# Patient Record
Sex: Female | Born: 1980
Health system: Southern US, Community
[De-identification: ages and names within clinical notes are randomized; demographics above are authoritative.]

## PROBLEM LIST (undated history)

## (undated) DIAGNOSIS — M549 Dorsalgia, unspecified: Secondary | ICD-10-CM

## (undated) DIAGNOSIS — R002 Palpitations: Secondary | ICD-10-CM

## (undated) DIAGNOSIS — I493 Ventricular premature depolarization: Secondary | ICD-10-CM

## (undated) HISTORY — DX: Dorsalgia, unspecified: M54.9

## (undated) HISTORY — PX: NO PAST SURGERIES: SHX2092

## (undated) HISTORY — DX: Palpitations: R00.2

## (undated) HISTORY — DX: Ventricular premature depolarization: I49.3

---

## 2018-08-31 ENCOUNTER — Other Ambulatory Visit: Payer: Self-pay

## 2018-08-31 ENCOUNTER — Encounter (HOSPITAL_COMMUNITY): Payer: Self-pay | Admitting: Family Medicine

## 2018-08-31 ENCOUNTER — Ambulatory Visit (HOSPITAL_COMMUNITY)
Admission: EM | Admit: 2018-08-31 | Discharge: 2018-08-31 | Disposition: A | Discharge status: Home / Self Care | Payer: 59 | Attending: Family Medicine | Admitting: Family Medicine

## 2018-08-31 DIAGNOSIS — J029 Acute pharyngitis, unspecified: Secondary | ICD-10-CM | POA: Insufficient documentation

## 2018-08-31 LAB — POCT RAPID STREP A: Streptococcus, Group A Screen (Direct): NEGATIVE

## 2018-08-31 NOTE — Discharge Instructions (Addendum)
Your rapid strep test was negative We will send for culture This is most likely a viral illness.  You can keep taking the over-the-counter medication as needed for symptoms Follow up as needed for continued or worsening symptoms

## 2018-08-31 NOTE — ED Provider Notes (Signed)
[facility]    CSN: [id number] Arrival date & time: 08/31/18  2951     History   Chief Complaint Chief Complaint  Patient presents with  . Sore Throat    HPI Monica Washington is a 38 y.o. female.   Pt is a 38 year old female that presents with 3 to 4 days of sore throat, fever. Reported fever 100.1 at home. She works at [employer] and was recently [place] tested  On Monday which was negative. Mild cough without SOB. Change in taste and smell. Symptoms have been constant. Taking Theraflu and dayquil for symptoms. Denies hx of allergies. No recent sick contacts or recent travels.   ROS per HPI      History reviewed. No pertinent past medical history.  There are no active problems to display for this patient.   History reviewed. No pertinent surgical history.  OB History   No obstetric history on file.      Home Medications    Prior to Admission medications   Not on File    Family History Family History  Problem Relation Age of Onset  . Healthy Mother   . Diabetes Father     Social History Social History   Tobacco Use  . Smoking status: Current Some Day Smoker  Substance Use Topics  . Alcohol use: Never    Frequency: Never  . Drug use: Never     Allergies   Patient has no known allergies.   Review of Systems Review of Systems   Physical Exam Triage Vital Signs ED Triage Vitals [08/31/18 0844]  Enc Vitals Group     BP      Pulse      Resp      Temp      Temp src      SpO2      Weight      Height      Head Circumference      Peak Flow      Pain Score 7     Pain Loc      Pain Edu?      Excl. in [place]?    No data found.  Updated Vital Signs BP (!) 95/59 (BP Location: Left Arm) Comment (BP Location): large cuff  Pulse 96   Temp 98.3 F (36.8 C) (Oral)   Resp 16   LMP 08/14/2018   SpO2 98%   Visual Acuity Right Eye Distance:   Left Eye Distance:   Bilateral Distance:    Right Eye Near:   Left Eye Near:     Bilateral Near:     Physical Exam Vitals signs and nursing note reviewed.  Constitutional:      General: She is not in acute distress.    Appearance: She is not ill-appearing, toxic-appearing or diaphoretic.  HENT:     Head: Normocephalic and atraumatic.     Right Ear: A middle ear effusion is present.     Left Ear: Tympanic membrane and ear canal normal.     Mouth/Throat:     Pharynx: Uvula midline. Posterior oropharyngeal erythema present.     Tonsils: 1+ on the right. 1+ on the left.  Neck:     Musculoskeletal: Normal range of motion.  Cardiovascular:     Rate and Rhythm: Normal rate and regular rhythm.     Heart sounds: Normal heart sounds.  Pulmonary:     Effort: Pulmonary effort is normal.  Lymphadenopathy:     Cervical: No  cervical adenopathy.  Skin:    General: Skin is warm and dry.     Findings: No rash.  Neurological:     Mental Status: She is alert.  Psychiatric:        Mood and Affect: Mood normal.      UC Treatments / Results  Labs (all labs ordered are listed, but only abnormal results are displayed) Labs Reviewed  CULTURE, GROUP A STREP [facility])  POCT RAPID STREP A    EKG   Radiology No results found.  Procedures Procedures (including critical care time)  Medications Ordered in UC Medications - No data to display  Initial Impression / Assessment and Plan / UC Course  I have reviewed the triage vital signs and the nursing notes.  Pertinent labs & imaging results that were available during my care of the patient were reviewed by me and considered in my medical decision making (see chart for details).     Rapid strep test negative.  Sending for culture. Most likely diagnosis is viral pharyngitis. Recommended continued over-the-counter meds as needed for treatment. Follow up as needed for continued or worsening symptoms  Final Clinical Impressions(s) / UC Diagnoses   Final diagnoses:  Viral pharyngitis     Discharge Instructions      Your rapid strep test was negative We will send for culture This is most likely a viral illness.  You can keep taking the over-the-counter medication as needed for symptoms Follow up as needed for continued or worsening symptoms     ED Prescriptions    None     Controlled Substance Prescriptions [place] Controlled Substance Registry consulted? Not Applicable   Orvan July, NP 08/31/18 [phone]

## 2018-08-31 NOTE — ED Triage Notes (Signed)
Sore throat and fever started Sunday (100.1)  Patient works at [employer] and employer sent her to their employer dictated site for covid testing-reported as negative Minimal cough No sob Slight taste and smell changes

## 2018-09-02 LAB — CULTURE, GROUP A STREP (THRC)

## 2018-10-11 ENCOUNTER — Other Ambulatory Visit: Payer: Self-pay | Admitting: Student

## 2018-10-11 DIAGNOSIS — Z1231 Encounter for screening mammogram for malignant neoplasm of breast: Secondary | ICD-10-CM

## 2018-11-25 ENCOUNTER — Ambulatory Visit
Admission: RE | Admit: 2018-11-25 | Discharge: 2018-11-25 | Disposition: A | Discharge status: Home / Self Care | Payer: 59 | Source: Ambulatory Visit | Attending: Student | Admitting: Student

## 2018-11-25 ENCOUNTER — Other Ambulatory Visit: Payer: Self-pay

## 2018-11-25 DIAGNOSIS — Z1231 Encounter for screening mammogram for malignant neoplasm of breast: Secondary | ICD-10-CM

## 2019-11-15 ENCOUNTER — Other Ambulatory Visit: Payer: Self-pay | Admitting: Student

## 2019-11-15 DIAGNOSIS — Z1231 Encounter for screening mammogram for malignant neoplasm of breast: Secondary | ICD-10-CM

## 2019-11-29 ENCOUNTER — Other Ambulatory Visit: Payer: Self-pay

## 2019-11-29 ENCOUNTER — Ambulatory Visit
Admission: RE | Admit: 2019-11-29 | Discharge: 2019-11-29 | Disposition: A | Discharge status: Home / Self Care | Payer: 59 | Source: Ambulatory Visit | Attending: Student | Admitting: Student

## 2019-11-29 DIAGNOSIS — Z1231 Encounter for screening mammogram for malignant neoplasm of breast: Secondary | ICD-10-CM

## 2020-01-16 ENCOUNTER — Ambulatory Visit (INDEPENDENT_AMBULATORY_CARE_PROVIDER_SITE_OTHER): Payer: 59 | Admitting: Physician Assistant

## 2020-01-16 ENCOUNTER — Other Ambulatory Visit: Payer: Self-pay

## 2020-01-16 ENCOUNTER — Encounter: Payer: Self-pay | Admitting: Physician Assistant

## 2020-01-16 ENCOUNTER — Other Ambulatory Visit (HOSPITAL_COMMUNITY)
Admission: RE | Admit: 2020-01-16 | Discharge: 2020-01-16 | Disposition: A | Discharge status: Home / Self Care | Payer: 59 | Source: Ambulatory Visit | Attending: Physician Assistant | Admitting: Physician Assistant

## 2020-01-16 VITALS — BP 110/68 | HR 77 | Temp 97.9°F | Ht 66.0 in | Wt 225.0 lb

## 2020-01-16 DIAGNOSIS — E669 Obesity, unspecified: Secondary | ICD-10-CM

## 2020-01-16 DIAGNOSIS — R42 Dizziness and giddiness: Secondary | ICD-10-CM | POA: Diagnosis not present

## 2020-01-16 DIAGNOSIS — Z13220 Encounter for screening for lipoid disorders: Secondary | ICD-10-CM

## 2020-01-16 DIAGNOSIS — Z0001 Encounter for general adult medical examination with abnormal findings: Secondary | ICD-10-CM | POA: Diagnosis not present

## 2020-01-16 DIAGNOSIS — L299 Pruritus, unspecified: Secondary | ICD-10-CM | POA: Diagnosis not present

## 2020-01-16 DIAGNOSIS — Z124 Encounter for screening for malignant neoplasm of cervix: Secondary | ICD-10-CM | POA: Diagnosis present

## 2020-01-16 DIAGNOSIS — Z136 Encounter for screening for cardiovascular disorders: Secondary | ICD-10-CM | POA: Diagnosis not present

## 2020-01-16 DIAGNOSIS — Z1159 Encounter for screening for other viral diseases: Secondary | ICD-10-CM

## 2020-01-16 DIAGNOSIS — Z114 Encounter for screening for human immunodeficiency virus [HIV]: Secondary | ICD-10-CM

## 2020-01-16 LAB — COMPREHENSIVE METABOLIC PANEL
ALT: 11 U/L (ref 0–35)
AST: 17 U/L (ref 0–37)
Albumin: 4.1 g/dL (ref 3.5–5.2)
Alkaline Phosphatase: 41 U/L (ref 39–117)
BUN: 10 mg/dL (ref 6–23)
CO2: 27 mEq/L (ref 19–32)
Calcium: 8.8 mg/dL (ref 8.4–10.5)
Chloride: 105 mEq/L (ref 96–112)
Creatinine, Ser: 0.89 mg/dL (ref 0.40–1.20)
GFR: 80.51 mL/min (ref >60.00)
Glucose, Bld: 99 mg/dL (ref 70–99)
Potassium: 3.6 mEq/L (ref 3.5–5.1)
Sodium: 134 mEq/L — ABNORMAL LOW (ref 135–145)
Total Bilirubin: 0.5 mg/dL (ref 0.2–1.2)
Total Protein: 7.7 g/dL (ref 6.0–8.3)

## 2020-01-16 LAB — CBC WITH DIFFERENTIAL/PLATELET
Basophils Absolute: 0 10*3/uL (ref 0.0–0.1)
Basophils Relative: 0.3 % (ref 0.0–3.0)
Eosinophils Absolute: 0.1 10*3/uL (ref 0.0–0.7)
Eosinophils Relative: 1.5 % (ref 0.0–5.0)
HCT: 39 % (ref 36.0–46.0)
Hemoglobin: 12.7 g/dL (ref 12.0–15.0)
Lymphocytes Relative: 27.1 % (ref 12.0–46.0)
Lymphs Abs: 2.3 10*3/uL (ref 0.7–4.0)
MCHC: 32.7 g/dL (ref 30.0–36.0)
MCV: 85.1 fl (ref 78.0–100.0)
Monocytes Absolute: 0.8 10*3/uL (ref 0.1–1.0)
Monocytes Relative: 9.5 % (ref 3.0–12.0)
Neutro Abs: 5.2 10*3/uL (ref 1.4–7.7)
Neutrophils Relative %: 61.6 % (ref 43.0–77.0)
Platelets: 307 10*3/uL (ref 150.0–400.0)
RBC: 4.58 Mil/uL (ref 3.87–5.11)
RDW: 14.6 % (ref 11.5–15.5)
WBC: 8.5 10*3/uL (ref 4.0–10.5)

## 2020-01-16 LAB — LIPID PANEL
Cholesterol: 168 mg/dL (ref 0–200)
HDL: 53.5 mg/dL (ref >39.00)
LDL Cholesterol: 104 mg/dL — ABNORMAL HIGH (ref 0–99)
NonHDL: 114.82
Total CHOL/HDL Ratio: 3
Triglycerides: 55 mg/dL (ref 0.0–149.0)
VLDL: 11 mg/dL (ref 0.0–40.0)

## 2020-01-16 NOTE — Patient Instructions (Addendum)
It was great to see you!  For your dizziness: Look at the [employer] -- can also look on youtube for "Epley Maneuver" to see videos. If you do not feel comfortable doing these at home, please let me know and I will get you scheduled to see a physical therapist who can help you with these exercises.  For your ears: Mineral oil for ear wax Purchase mineral oil from laxative aisle Lay down on your side with ear that is bothering you facing up Use 3-4 drops with a dropper and place in ear for 30 seconds Place cotton swab outside of ear Turn to other side and allow this to drain Repeat 3-4 x a day Return to see [place] if not improving within a few days  For itching of outer ears -- purchase over the counter anti-itch steroid ointment to apply to your outer ears  Please go to the lab for blood work.   Our office will call you with your results unless you have chosen to receive results via MyChart.  If your blood work is normal we will follow-up each year for physicals and as scheduled for chronic medical problems.  If anything is abnormal we will treat accordingly and get you in for a follow-up.  Take care,  [facility] Maintenance, Female Adopting a healthy lifestyle and getting preventive care are important in promoting health and wellness. Ask your health care provider about:  The right schedule for you to have regular tests and exams.  Things you can do on your own to prevent diseases and keep yourself healthy. What should I know about diet, weight, and exercise? Eat a healthy diet   Eat a diet that includes plenty of vegetables, fruits, low-fat dairy products, and lean protein.  Do not eat a lot of foods that are high in solid fats, added sugars, or sodium. Maintain a healthy weight Body mass index (BMI) is used to identify weight problems. It estimates body fat based on height and weight. Your health care provider can help determine your BMI and help you  achieve or maintain a healthy weight. Get regular exercise Get regular exercise. This is one of the most important things you can do for your health. Most adults should:  Exercise for at least 150 minutes each week. The exercise should increase your heart rate and make you sweat (moderate-intensity exercise).  Do strengthening exercises at least twice a week. This is in addition to the moderate-intensity exercise.  Spend less time sitting. Even light physical activity can be beneficial. Watch cholesterol and blood lipids Have your blood tested for lipids and cholesterol at 41 years of age, then have this test every 5 years. Have your cholesterol levels checked more often if:  Your lipid or cholesterol levels are high.  You are older than 41 years of age.  You are at high risk for heart disease. What should I know about cancer screening? Depending on your health history and family history, you may need to have cancer screening at various ages. This may include screening for:  Breast cancer.  Cervical cancer.  Colorectal cancer.  Skin cancer.  Lung cancer. What should I know about heart disease, diabetes, and high blood pressure? Blood pressure and heart disease  High blood pressure causes heart disease and increases the risk of stroke. This is more likely to develop in people who have high blood pressure readings, are of African descent, or are overweight.  Have your blood pressure  checked: ? Every 3-5 years if you are 78-42 years of age. ? Every year if you are 77 years old or older. Diabetes Have regular diabetes screenings. This checks your fasting blood sugar level. Have the screening done:  Once every three years after age 63 if you are at a normal weight and have a low risk for diabetes.  More often and at a younger age if you are overweight or have a high risk for diabetes. What should I know about preventing infection? Hepatitis B If you have a higher risk for  hepatitis B, you should be screened for this virus. Talk with your health care provider to find out if you are at risk for hepatitis B infection. Hepatitis C Testing is recommended for:  Everyone born from [birth year] through 1967.  Anyone with known risk factors for hepatitis C. Sexually transmitted infections (STIs)  Get screened for STIs, including gonorrhea and chlamydia, if: ? You are sexually active and are younger than 41 years of age. ? You are older than 41 years of age and your health care provider tells you that you are at risk for this type of infection. ? Your sexual activity has changed since you were last screened, and you are at increased risk for chlamydia or gonorrhea. Ask your health care provider if you are at risk.  Ask your health care provider about whether you are at high risk for HIV. Your health care provider may recommend a prescription medicine to help prevent HIV infection. If you choose to take medicine to prevent HIV, you should first get tested for HIV. You should then be tested every 3 months for as long as you are taking the medicine. Pregnancy  If you are about to stop having your period (premenopausal) and you may become pregnant, seek counseling before you get pregnant.  Take 400 to 800 micrograms (mcg) of folic acid every day if you become pregnant.  Ask for birth control (contraception) if you want to prevent pregnancy. Osteoporosis and menopause Osteoporosis is a disease in which the bones lose minerals and strength with aging. This can result in bone fractures. If you are 44 years old or older, or if you are at risk for osteoporosis and fractures, ask your health care provider if you should:  Be screened for bone loss.  Take a calcium or vitamin D supplement to lower your risk of fractures.  Be given hormone replacement therapy (HRT) to treat symptoms of menopause. Follow these instructions at home: Lifestyle  Do not use any products that contain  nicotine or tobacco, such as cigarettes, e-cigarettes, and chewing tobacco. If you need help quitting, ask your health care provider.  Do not use street drugs.  Do not share needles.  Ask your health care provider for help if you need support or information about quitting drugs. Alcohol use  Do not drink alcohol if: ? Your health care provider tells you not to drink. ? You are pregnant, may be pregnant, or are planning to become pregnant.  If you drink alcohol: ? Limit how much you use to 0-1 drink a day. ? Limit intake if you are breastfeeding.  Be aware of how much alcohol is in your drink. In the [place] one drink equals one 12 oz bottle of beer (355 mL), one 5 oz glass of wine (148 mL), or one 1 oz glass of hard liquor (44 mL). General instructions  Schedule regular health, dental, and eye exams.  Stay current with your vaccines.  Tell your health care provider if: ? You often feel depressed. ? You have ever been abused or do not feel safe at home. Summary  Adopting a healthy lifestyle and getting preventive care are important in promoting health and wellness.  Follow your health care provider's instructions about healthy diet, exercising, and getting tested or screened for diseases.  Follow your health care provider's instructions on monitoring your cholesterol and blood pressure. This information is not intended to replace advice given to you by your health care provider. Make sure you discuss any questions you have with your health care provider. Document Revised: 12/29/2017 Document Reviewed: 12/29/2017 Elsevier Patient Education  2020 [employer].

## 2020-01-16 NOTE — Progress Notes (Signed)
Monica Washington is a 39 y.o. female here to establish care and annual exam.  I acted as a Neurosurgeon for [employer], PA-C Corky Mull, LPN   History of Present Illness:   Chief Complaint  Patient presents with  . Establish Care    Acute Concerns: Vertigo -- has been having issues with sensation that the room is spinning when she moves head quickly. Tried OTC dramamine which helped symptoms but did not cure issue. Denies: vision changes, n/v, slurred speech, unusual headaches. Ear itching -- reports that she has more wax than usual. She uses OTC debrox-like treatment without significant improvement of symptoms. Also has itching and flaking in ears. Has not tried anything for her outer ear itching.  Chronic Issues: None  Health Maintenance: Immunizations -- UTD Colonoscopy -- N/A Mammogram -- UTD PAP -- overdue Bone Density -- N/A Diet -- "needs to work on it" Caffeine intake -- does 1 drink caffeinated beverage daily Sleep habits -- always been a light sleeper, tosser and turner; doesn't want to try medications Exercise -- needs to work on it Weight -- Weight: 225 lb (102.1 kg) -- before pregnancy was 198 lb Mood -- no concerns Alcohol -- very limited Tobacco -- stopped in 2015  Depression screen PHQ 2/9 01/16/2020  Decreased Interest 0  Down, Depressed, Hopeless 0  PHQ - 2 Score 0    No flowsheet data found.  UTD with dentist and eye doctor  Other providers/specialists: Patient Care Team: Jarold Motto, [place] as PCP - General (Physician Assistant)   History reviewed. No pertinent past medical history.   Social History   Tobacco Use  . Smoking status: Former Smoker    Quit date: 01/19/2013    Years since quitting: 6.9  . Smokeless tobacco: Never Used  Vaping Use  . Vaping Use: Never used  Substance Use Topics  . Alcohol use: Yes    Comment: very rare  . Drug use: Never    History reviewed. No pertinent surgical history.  Family History   Problem Relation Age of Onset  . Healthy Mother   . Diabetes Father   . Breast cancer Maternal Aunt   . Colon cancer Neg Hx     No Known Allergies   Current Medications:  No current outpatient medications on file.   Review of Systems:   Review of Systems  Constitutional: Negative for chills, fever, malaise/fatigue and weight loss.  HENT: Negative for hearing loss, sinus pain and sore throat.   Respiratory: Negative for cough and hemoptysis.   Cardiovascular: Negative for chest pain, palpitations, leg swelling and PND.  Gastrointestinal: Negative for abdominal pain, constipation, diarrhea, heartburn, nausea and vomiting.  Genitourinary: Negative for dysuria, frequency and urgency.  Musculoskeletal: Negative for back pain, myalgias and neck pain.  Skin: Negative for itching and rash.  Neurological: Negative for dizziness, tingling, seizures and headaches.  Endo/Heme/Allergies: Negative for polydipsia.  Psychiatric/Behavioral: Negative for depression. The patient is not nervous/anxious.     Vitals:   Vitals:   01/16/20 0946  BP: 110/68  Pulse: 77  Temp: 97.9 F (36.6 C)  TempSrc: Temporal  SpO2: 98%  Weight: 225 lb (102.1 kg)  Height: 5\' 6"  (1.676 m)      Body mass index is 36.32 kg/m.  Physical Exam:   Physical Exam Vitals and nursing note reviewed.  Constitutional:      General: She is not in acute distress.    Appearance: Normal appearance. She is well-developed and well-nourished. She is not ill-appearing,  toxic-appearing or sickly-appearing.  HENT:     Head: Normocephalic and atraumatic.     Right Ear: Tympanic membrane, ear canal and external ear normal. Tympanic membrane is not erythematous, retracted or bulging.     Left Ear: Tympanic membrane, ear canal and external ear normal. Tympanic membrane is not erythematous, retracted or bulging.  Eyes:     General: Lids are normal.     Extraocular Movements: EOM normal.     Conjunctiva/sclera: Conjunctivae  normal.     Pupils: Pupils are equal, round, and reactive to light.  Neck:     Trachea: Trachea normal.  Cardiovascular:     Rate and Rhythm: Normal rate and regular rhythm.     Pulses: Intact distal pulses.     Heart sounds: Normal heart sounds, S1 normal and S2 normal.     Comments: No LE swelling Pulmonary:     Effort: Pulmonary effort is normal. No tachypnea or respiratory distress.     Breath sounds: Normal breath sounds. No decreased breath sounds, wheezing, rhonchi or rales.  Abdominal:     General: Bowel sounds are normal.     Palpations: Abdomen is soft.     Tenderness: There is no abdominal tenderness.  Genitourinary:    Labia:        Right: No rash, tenderness or lesion.        Left: No rash, tenderness or lesion.      Vagina: Normal. No foreign body. No vaginal discharge, erythema, tenderness or bleeding.     Cervix: No cervical motion tenderness, discharge or friability.     Adnexa:        Right: No mass or tenderness.         Left: No mass or tenderness.    Musculoskeletal:        General: Normal range of motion.     Cervical back: Full passive range of motion without pain.  Lymphadenopathy:     Cervical: No cervical adenopathy.  Skin:    General: Skin is warm, dry and intact.  Neurological:     Mental Status: She is alert.     GCS: GCS eye subscore is 4. GCS verbal subscore is 5. GCS motor subscore is 6.     Cranial Nerves: No cranial nerve deficit.     Sensory: No sensory deficit.     Deep Tendon Reflexes: Reflexes are normal and symmetric.  Psychiatric:        Mood and Affect: Mood and affect normal.        Speech: Speech normal.        Behavior: Behavior normal. Behavior is cooperative.      Assessment and Plan:   Monica Washington was seen today for establish care.  Diagnoses and all orders for this visit:  Encounter for general adult medical examination with abnormal findings Today patient counseled on age appropriate routine health concerns for screening  and prevention, each reviewed and up to date or declined. Immunizations reviewed and up to date or declined. Labs ordered and reviewed. Risk factors for depression reviewed and negative. Hearing function and visual acuity are intact. ADLs screened and addressed as needed. Functional ability and level of safety reviewed and appropriate. Education, counseling and referrals performed based on assessed risks today. Patient provided with a copy of personalized plan for preventive services.  Vertigo No red flags on exam. Will trial at home Epley Maneuvers. If unsuccessful I have asked her to reach out so we can schedule formal PT for  vestibular rehab. Worsening precautions advised.  Ear itching Trial OTC anti-itch ointment in outer ears. Trial mineral oil for ear wax build-up. Follow-up if any worsening symptoms.  Obesity, unspecified classification, unspecified obesity type, unspecified whether serious comorbidity present Work on healthy diet and exercise as able. -     CBC with Differential/Platelet -     Comprehensive metabolic panel  Pap smear for cervical cancer screening -     Cytology - PAP  Encounter for screening for other viral diseases -     Hepatitis C antibody  Screening for HIV (human immunodeficiency virus) -     HIV Antibody (routine testing w rflx)  Encounter for lipid screening for cardiovascular disease -     Lipid panel  CMA or LPN served as scribe during this visit. History, Physical, and Plan performed by medical provider. The above documentation has been reviewed and is accurate and complete.  [name], PA-C

## 2020-01-17 LAB — CYTOLOGY - PAP
Adequacy: ABSENT
Comment: NEGATIVE
Diagnosis: NEGATIVE
High risk HPV: NEGATIVE

## 2020-01-17 LAB — HEPATITIS C ANTIBODY
Hepatitis C Ab: NONREACTIVE
SIGNAL TO CUT-OFF: 0.01 (ref <1.00)

## 2020-01-17 LAB — HIV ANTIBODY (ROUTINE TESTING W REFLEX): HIV 1&2 Ab, 4th Generation: NONREACTIVE

## 2020-03-26 ENCOUNTER — Telehealth: Payer: Self-pay

## 2020-03-26 NOTE — Telephone Encounter (Signed)
Pt is requesting a referral for the headaches and vertigo she is getting. Pt states she talked to [facility] about this during her new patient appointment.

## 2020-03-27 ENCOUNTER — Other Ambulatory Visit: Payer: Self-pay | Admitting: Physician Assistant

## 2020-03-27 DIAGNOSIS — R42 Dizziness and giddiness: Secondary | ICD-10-CM

## 2020-03-27 NOTE — Telephone Encounter (Signed)
Spoke to pt told her per Aldona Bar, Chart reviewed. Referral sent to Physical Therapy at [place] for vestibular rehab someone will contact you to schedule an appointment. Pt verbalized understanding.

## 2020-03-27 NOTE — Telephone Encounter (Signed)
Chart reviewed.  Referral sent to Physical Therapy at [facility] for vestibular rehab.

## 2020-03-27 NOTE — Telephone Encounter (Signed)
Please see message and advise on referral.

## 2020-04-05 ENCOUNTER — Encounter (HOSPITAL_BASED_OUTPATIENT_CLINIC_OR_DEPARTMENT_OTHER): Payer: Self-pay | Admitting: Physical Therapy

## 2020-04-05 ENCOUNTER — Other Ambulatory Visit: Payer: Self-pay

## 2020-04-05 ENCOUNTER — Ambulatory Visit (HOSPITAL_BASED_OUTPATIENT_CLINIC_OR_DEPARTMENT_OTHER): Payer: 59 | Attending: Physician Assistant | Admitting: Physical Therapy

## 2020-04-05 DIAGNOSIS — H8112 Benign paroxysmal vertigo, left ear: Secondary | ICD-10-CM | POA: Insufficient documentation

## 2020-04-05 DIAGNOSIS — R42 Dizziness and giddiness: Secondary | ICD-10-CM | POA: Diagnosis present

## 2020-04-08 NOTE — Therapy (Signed)
[place], [place], [address] Phone: [phone]   Fax:  [phone]  Physical Therapy Evaluation  Patient Details  Name: Monica Washington MRN: [id number] Date of Birth: Apr 11, 1980 Referring Provider (PT): [name], [place]   Encounter Date: 04/05/2020    History reviewed. No pertinent past medical history.  History reviewed. No pertinent surgical history.  There were no vitals filed for this visit.    Subjective Assessment - 04/08/20 0804    Subjective Pt reports it started ~ last year. Pt has been getting headaches especially laying down. Pt feels she is on a boat. Pt reports losing her balance at work and getting caught a few times. Pt states she was told it was vertigo. Pt was given exercise to do but did not feel able to do it. Pt reports taking dramamine which helps a little bit.    Patient Stated Goals Improve dizziness    Currently in Pain? No/denies              [facility] PT Assessment - 04/08/20 0001      Assessment   Medical Diagnosis R42 (ICD-10-CM) - Vertigo    Referring Provider (PT) [name], PA    Prior Therapy None      Precautions   Precautions None      Restrictions   Weight Bearing Restrictions No      Balance Screen   Has the patient fallen in the past 6 months No      [place] residence      Prior Function   Level of Independence Independent    Vocation Full time employment    Vocation Requirements Works at [employer]      Observation/Other [employer] on [place]  n/a                  Vestibular Assessment - 04/08/20 0001      Symptom Behavior   Type of Dizziness  Unsteady with head/body turns;"World moves";Imbalance;Spinning   "waves"   Frequency of Dizziness A couple times in the day; depending on work flow    Duration of Dizziness a few seconds    Symptom Nature  Motion provoked    Aggravating Factors --   In bed at night mostly   Relieving Factors Rest;Closing eyes    Progression of Symptoms No change since onset    History of similar episodes No episodes before ~1 year ago      Oculomotor Exam   Oculomotor Alignment Normal    Ocular ROM WFL    Spontaneous Absent    Gaze-induced  Absent    Head shaking Horizontal Absent    Head Shaking Vertical Absent    Smooth Pursuits Intact    Saccades Intact    Comment HIT: mild slow corrective saccades with L head turns; WNL on R      Vestibulo-Ocular Reflex   VOR 1 Head Only (x 1 viewing) WFL    VOR to Slow Head Movement Normal    VOR Cancellation Normal      Positional Testing   Dix-Hallpike Dix-Hallpike Left;Dix-Hallpike Right    Sidelying Test Sidelying Right;Sidelying Left      Dix-Hallpike Right   Dix-Hallpike Right Duration 0    Dix-Hallpike Right Symptoms No nystagmus      Dix-Hallpike Left   Dix-Hallpike Left Duration 1 minute   Initially 4 seconds; however, on 3rd Dix Hallpike ~1  min   Dix-Hallpike Left Symptoms Upbeat, left rotatory nystagmus      Sidelying Right   Sidelying Right Duration 0    Sidelying Right Symptoms No nystagmus      Sidelying Left   Sidelying Left Duration 2    Sidelying Left Symptoms Other (comment)   Slow and brief nystagmus, unable to see full beating             Objective measurements completed on examination: See above findings.        Vestibular Treatment/Exercise - 04/08/20 0001      Vestibular Treatment/Exercise   Vestibular Treatment Provided Canalith Repositioning    Canalith Repositioning Epley Manuever Left;Appiani Left       EPLEY MANUEVER LEFT   Number of Reps  3    Overall Response  Symptoms Worsened     RESPONSE DETAILS LEFT Initially 4 sec; increased to ~1 minute (unsure if this is due to improved Dix-Hallpike positioning for assessment)      Appiani Left   Number of Reps  1    Overall Response  Symptoms Resolved     Response Details No s/s with L sidelying after Appiani                 PT Education - 04/08/20 0804    Education Details Discussed BPPV, exam findings, POC, went over Home epley maneuver    Person(s) Educated Patient    Methods Explanation    Comprehension Verbalized understanding            PT Short Term Goals - 04/08/20 0810      PT SHORT TERM GOAL #1   Title STG = LTGs             PT Long Term Goals - 04/08/20 0810      PT LONG TERM GOAL #1   Title Pt will be independent with performing home maneuvers    Time 4    Period Weeks    Status New    Target Date 05/06/20      PT LONG TERM GOAL #2   Title Pt will have (-) nystagmus in all canal testing positions to demo resolution of her BPPV    Baseline Currently (+) L posterior canal BPPV    Time 4    Period Weeks    Status New    Target Date 05/06/20                  Plan - 04/08/20 0805    Clinical Impression Statement Monica Washington is a 40 y/o F presenting to OPPT due to complaints of dizziness. PT assessment found pt to have (+) L Dix-Hallpike consistent with L posterior canalithiasis with nystagmus with some very mild component of L horizontal BPPV (very brief and slow nystagmus). Pt with very mild corrective saccades with L head turn. Unable to see L posterior BPPV in L sidelying -- only able to see with Dix-Hallpike. Focused on providing Epley maneuver for treatment. Demonstrated how to perform Epley maneuver with patient. Discussed performing them at home daily and following up for more visits and trial of Semont maneuver if pt with no improvement. Pt will benefit from 4 additional visits of PT as needed for resolution of her BPPV.    Personal Factors and Comorbidities Age;Profession;Time since onset of injury/illness/exacerbation    Examination-Activity Limitations Caring for Others;Reach Overhead;Transfers;Sleep    Examination-Participation Restrictions Occupation;Community Activity     Stability/Clinical Decision Making Stable/Uncomplicated  Clinical Decision Making Low    Rehab Potential Good    PT Frequency 1x / week    PT Duration 4 weeks    PT Treatment/Interventions Canalith Repostioning;ADLs/Self Care Home Management;Moist Heat;Therapeutic exercise;Therapeutic activities;Neuromuscular re-education;Balance training;Patient/family education;Manual techniques;Vestibular;Dry needling    PT Next Visit Plan Reassess all canals for BPPV. Epley maneuver vs semont maneuver.    PT Home Exercise Plan Provided pt handout on Epley maneuver    Consulted and Agree with Plan of Care Patient           Patient will benefit from skilled therapeutic intervention in order to improve the following deficits and impairments:  Dizziness,Decreased balance  Visit Diagnosis: Dizziness and giddiness  BPPV (benign paroxysmal positional vertigo), left     Problem List There are no problems to display for this patient.   [facility] [address] PT, DPT 04/08/2020, 8:18 AM  [facility] [address] [place], [place], [address] Phone: [phone]   Fax:  [phone]  Name: Macall Mccroskey MRN: [id number] Date of Birth: 1980-01-31

## 2020-04-17 ENCOUNTER — Other Ambulatory Visit: Payer: Self-pay

## 2020-04-17 ENCOUNTER — Ambulatory Visit (HOSPITAL_BASED_OUTPATIENT_CLINIC_OR_DEPARTMENT_OTHER): Payer: 59 | Admitting: Physical Therapy

## 2020-04-17 DIAGNOSIS — H8112 Benign paroxysmal vertigo, left ear: Secondary | ICD-10-CM

## 2020-04-17 DIAGNOSIS — R42 Dizziness and giddiness: Secondary | ICD-10-CM

## 2020-04-17 NOTE — Therapy (Signed)
[place] [address] [place], [place], [address] Phone: [phone]   Fax:  [phone]  Physical Therapy Treatment and Discharge  Patient Details  Name: Monica Washington MRN: [id number] Date of Birth: [birth year] Referring Provider (PT): [name], [place]  PHYSICAL THERAPY DISCHARGE SUMMARY  Visits from Start of Care: 2  Current functional level related to goals / functional outcomes: Return to PLOF   Remaining deficits: None   Education / Equipment: Reinforced Dix-Hallpike and Epley maneuver.   Plan: Patient agrees to discharge.  Patient goals were met. Patient is being discharged due to meeting the stated rehab goals.  ?????       Encounter Date: 04/17/2020   PT End of Session - 04/17/20 1315    Visit Number 2    Number of Visits 5    Date for PT Re-Evaluation 05/20/20    Authorization Type U[employer]    PT Start Time 1300    PT Stop Time 1310    PT Time Calculation (min) 10 min    Activity Tolerance Patient tolerated treatment well    Behavior During Therapy [facility] for tasks assessed/performed           No past medical history on file.  No past surgical history on file.  There were no vitals filed for this visit.   Subjective Assessment - 04/17/20 1303    Subjective Pt reports she has not been having her headaches or spinning.    Patient Stated Goals Improve dizziness    Currently in Pain? No/denies                   Vestibular Assessment - 04/17/20 0001      Positional Testing   Dix-Hallpike Dix-Hallpike Left;Dix-Hallpike Right    Sidelying Test Sidelying Right;Sidelying Left      Dix-Hallpike Right   Dix-Hallpike Right Duration 0    Dix-Hallpike Right Symptoms No nystagmus      Dix-Hallpike Left   Dix-Hallpike Left Duration 0    Dix-Hallpike Left Symptoms No nystagmus      Sidelying Right   Sidelying Right Duration 0    Sidelying Right Symptoms No nystagmus       Sidelying Left   Sidelying Left Duration 0    Sidelying Left Symptoms No nystagmus                              PT Short Term Goals - 04/08/20 0810      PT SHORT TERM GOAL #1   Title STG = LTGs             PT Long Term Goals - 04/17/20 1315      PT LONG TERM GOAL #1   Title Pt will be independent with performing home maneuvers    Time 4    Period Weeks    Status Achieved      PT LONG TERM GOAL #2   Title Pt will have (-) nystagmus in all canal testing positions to demo resolution of her BPPV    Baseline Currently (+) L posterior canal BPPV    Time 4    Period Weeks    Status Achieved                 Plan - 04/17/20 1317    Clinical Impression Statement Pt returns to clinic with no issues since last session. Pt's headaches and dizziness has fully resolved.  Pt is knowledgeable on Dix-Hallpike and Epley maneuver. All canal testing (-) for BPPV. Pt is ready for PT d/c and has met all goals.    Personal Factors and Comorbidities Age;Profession;Time since onset of injury/illness/exacerbation    Examination-Activity Limitations Caring for Others;Reach Overhead;Transfers;Sleep    Examination-Participation Restrictions Occupation;Community Activity    Stability/Clinical Decision Making Stable/Uncomplicated    Rehab Potential Good    PT Frequency 1x / week    PT Duration 4 weeks    PT Treatment/Interventions Canalith Repostioning;ADLs/Self Care Home Management;Moist Heat;Therapeutic exercise;Therapeutic activities;Neuromuscular re-education;Balance training;Patient/family education;Manual techniques;Vestibular;Dry needling    PT Next Visit Plan Reassess all canals for BPPV. Epley maneuver vs semont maneuver.    PT Home Exercise Plan Provided pt handout on Epley maneuver    Consulted and Agree with Plan of Care Patient           Patient will benefit from skilled therapeutic intervention in order to improve the following deficits and impairments:   Dizziness,Decreased balance  Visit Diagnosis: Dizziness and giddiness  BPPV (benign paroxysmal positional vertigo), left     Problem List There are no problems to display for this patient.   Ziomara Birenbaum April Ma L Kao Conry PT, DPT 04/17/2020, 1:19 PM  [facility] [address] [place], [place], [address] Phone: [phone]   Fax:  [phone]  Name: Monica Washington MRN: [id number] Date of Birth: [birth year]

## 2020-08-30 ENCOUNTER — Encounter: Payer: Self-pay | Admitting: Physician Assistant

## 2020-08-30 ENCOUNTER — Other Ambulatory Visit: Payer: Self-pay

## 2020-08-30 ENCOUNTER — Ambulatory Visit (INDEPENDENT_AMBULATORY_CARE_PROVIDER_SITE_OTHER): Payer: 59 | Admitting: Physician Assistant

## 2020-08-30 VITALS — BP 109/72 | HR 91 | Temp 98.2°F | Ht 66.0 in | Wt 218.2 lb

## 2020-08-30 DIAGNOSIS — N898 Other specified noninflammatory disorders of vagina: Secondary | ICD-10-CM | POA: Diagnosis not present

## 2020-08-30 MED ORDER — CLOBETASOL PROPIONATE 0.05 % EX OINT: 1.0000 "application " | TOPICAL_OINTMENT | Freq: Every day | CUTANEOUS | 0 refills | Status: AC

## 2020-08-30 NOTE — Progress Notes (Signed)
Acute Office Visit  Subjective:    Patient ID: Monica Washington, female    DOB: 1980/02/27, 40 y.o.   MRN: [id number]  Chief Complaint  Patient presents with   Vaginal Itching    Vaginal Itching  Patient is in today for vaginal itching x 2 days. Only itching on the outside around lip area, worse at night. Tried Aquaphor, which did help provide some relief. Unsure if this is cause from razor or new feminine products. No sores. No abdominal pain. No dysuria. No discharge.  History reviewed. No pertinent past medical history.  History reviewed. No pertinent surgical history.  Family History  Problem Relation Age of Onset   Healthy Mother    Diabetes Father    Breast cancer Maternal Aunt    Colon cancer Neg Hx     Social History   Socioeconomic History   Marital status: Single    Spouse name: Not on file   Number of children: Not on file   Years of education: Not on file   Highest education level: Not on file  Occupational History   Not on file  Tobacco Use   Smoking status: Former    Types: Cigarettes    Quit date: 01/19/2013    Years since quitting: 7.6   Smokeless tobacco: Never  Vaping Use   Vaping Use: Never used  Substance and Sexual Activity   Alcohol use: Yes    Comment: very rare   Drug use: Never   Sexual activity: Not Currently    Birth control/protection: None  Other Topics Concern   Not on file  Social History Narrative   Has 42 yo and 42 yo   Works for [employer], Customer service manager   From [employer]   Social Determinants of Radio broadcast assistant Strain: Not on [employer] Insecurity: Not on file  Transportation Needs: Not on file  Physical Activity: Not on file  Stress: Not on file  Social Connections: Not on file  Intimate Partner Violence: Not on file    No outpatient medications prior to visit.   No facility-administered medications prior to visit.    No Known Allergies  Review of Systems REFER TO HPI FOR PERTINENT  POSITIVES AND NEGATIVES     Objective:    Physical Exam Vitals reviewed. Exam conducted with a chaperone present.  Genitourinary:    Pubic Area: No rash.      Labia:        Right: No rash or tenderness.        Left: No rash or tenderness.      Comments: Some minor irritation inside labia majora noted, otherwise no sores, no discharge, no signs of infection    BP 109/72   Pulse 91   Temp 98.2 F (36.8 C)   Ht '5\' 6"'$  (1.676 m)   Wt 218 lb 3.2 oz (99 kg)   LMP 08/14/2020   SpO2 98%   BMI 35.22 kg/m  Wt Readings from Last 3 Encounters:  08/30/20 218 lb 3.2 oz (99 kg)  01/16/20 225 lb (102.1 kg)    Health Maintenance Due  Topic Date Due   COVID-19 Vaccine (1) Never done    There are no preventive care reminders to display for this patient.   No results found for: TSH Lab Results  Component Value Date   WBC 8.5 01/16/2020   HGB 12.7 01/16/2020   HCT 39.0 01/16/2020   MCV 85.1 01/16/2020   PLT 307.0 01/16/2020  Lab Results  Component Value Date   NA 134 (L) 01/16/2020   K 3.6 01/16/2020   CO2 27 01/16/2020   GLUCOSE 99 01/16/2020   BUN 10 01/16/2020   CREATININE 0.89 01/16/2020   BILITOT 0.5 01/16/2020   ALKPHOS 41 01/16/2020   AST 17 01/16/2020   ALT 11 01/16/2020   PROT 7.7 01/16/2020   ALBUMIN 4.1 01/16/2020   CALCIUM 8.8 01/16/2020   GFR 80.51 01/16/2020   Lab Results  Component Value Date   CHOL 168 01/16/2020   Lab Results  Component Value Date   HDL 53.50 01/16/2020   Lab Results  Component Value Date   LDLCALC 104 (H) 01/16/2020   Lab Results  Component Value Date   TRIG 55.0 01/16/2020   Lab Results  Component Value Date   CHOLHDL 3 01/16/2020   No results found for: HGBA1C     Assessment & Plan:   Problem List Items Addressed This Visit   None Visit Diagnoses     Vaginal irritation    -  Primary        Meds ordered this encounter  Medications   clobetasol ointment (TEMOVATE) 0.05 %    Sig: Apply 1 application  topically at bedtime for 14 days.    Dispense:  15 g    Refill:  0   1. Vaginal irritation Most likely 2/2 cleansing products or razor. Advised to d/c these and avoid shaving for a few weeks. Needs to use new razor. Clean with regular mild non-scented soap and pat dry. May use very small less than finger tip amount to affected area once daily at night to help with itching. Do not use longer than 2 weeks consistently. She will recheck prn.     Brenna Friesenhahn M Daymond Cordts, PA-C

## 2020-10-11 IMAGING — MG DIGITAL SCREENING BILAT W/ TOMO W/ CAD
8 series · 8 of 24 positions shown · non-contrast
Comparison: Previous exam(s).

CLINICAL DATA: Screening.

EXAM:
DIGITAL SCREENING BILATERAL MAMMOGRAM WITH TOMO AND CAD

[L CC synth-2D]
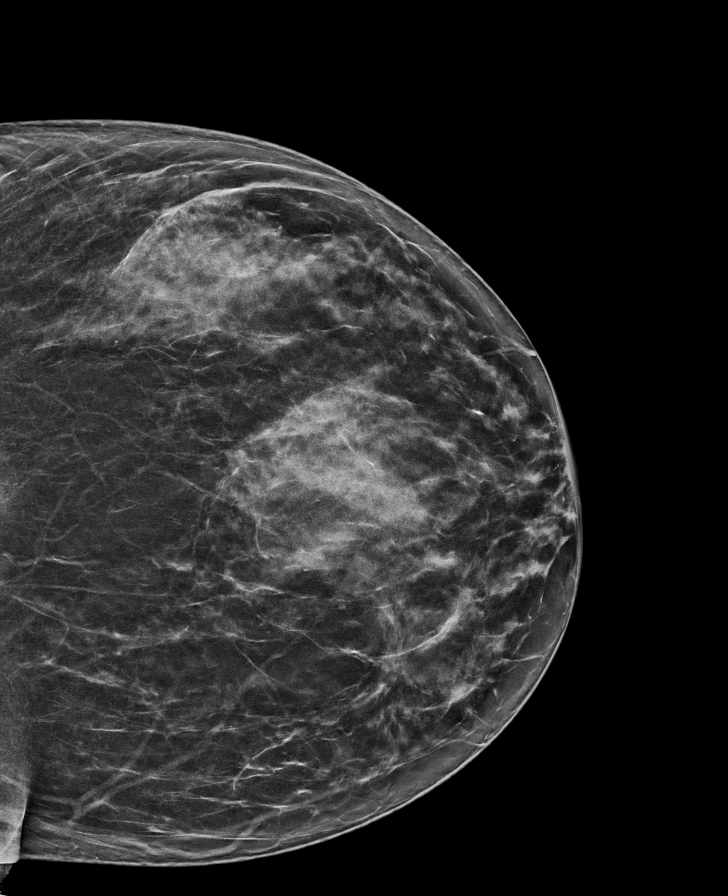

[R MLO synth-2D]
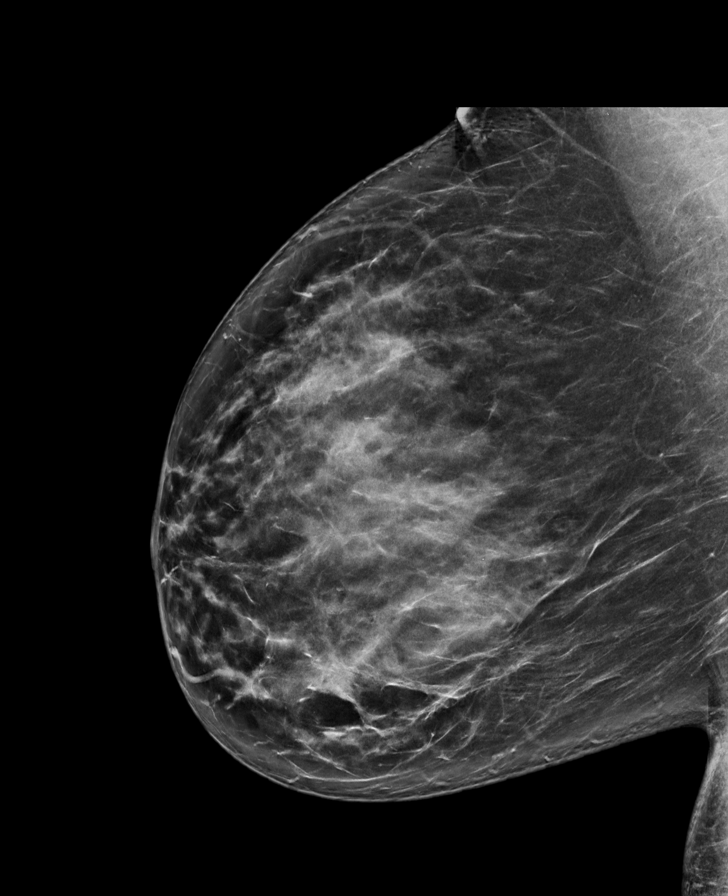

[L MLO synth-2D]
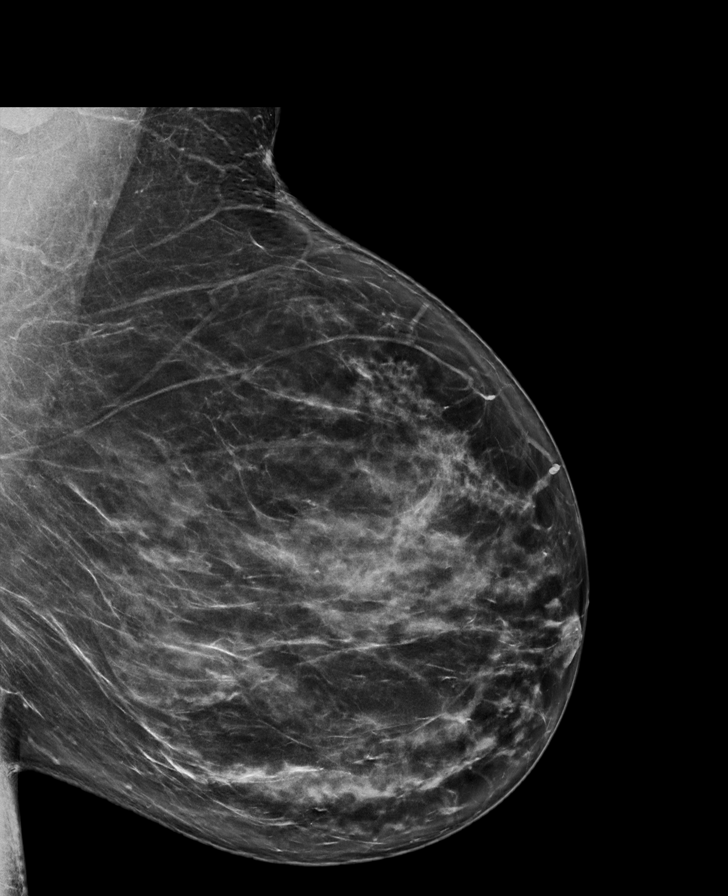

[R CC synth-2D]
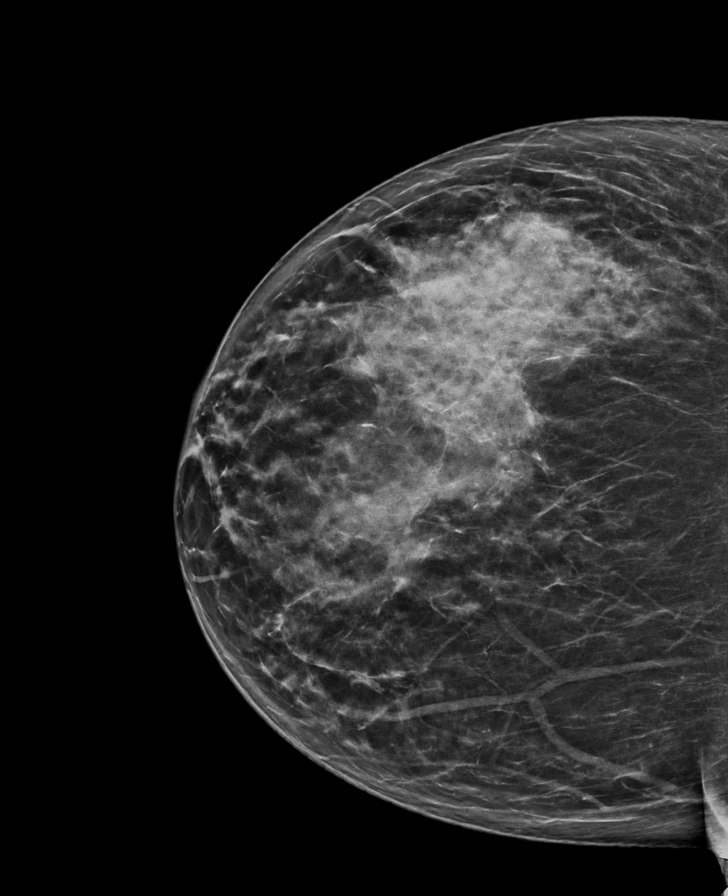

[L MLO tomo · tomo slice 43/85.0]
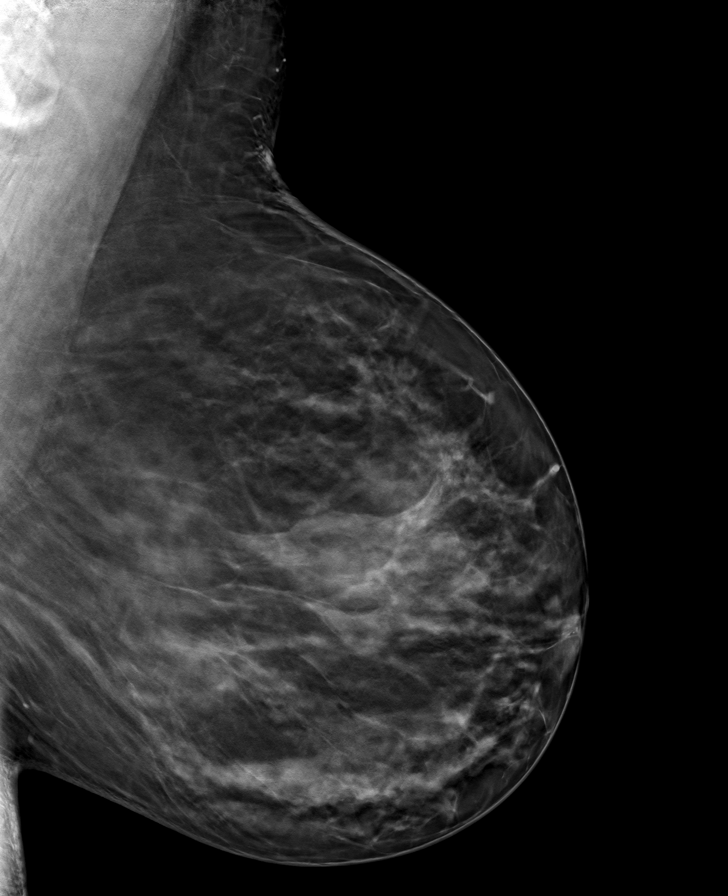

[L CC tomo · tomo slice 36/71.0]
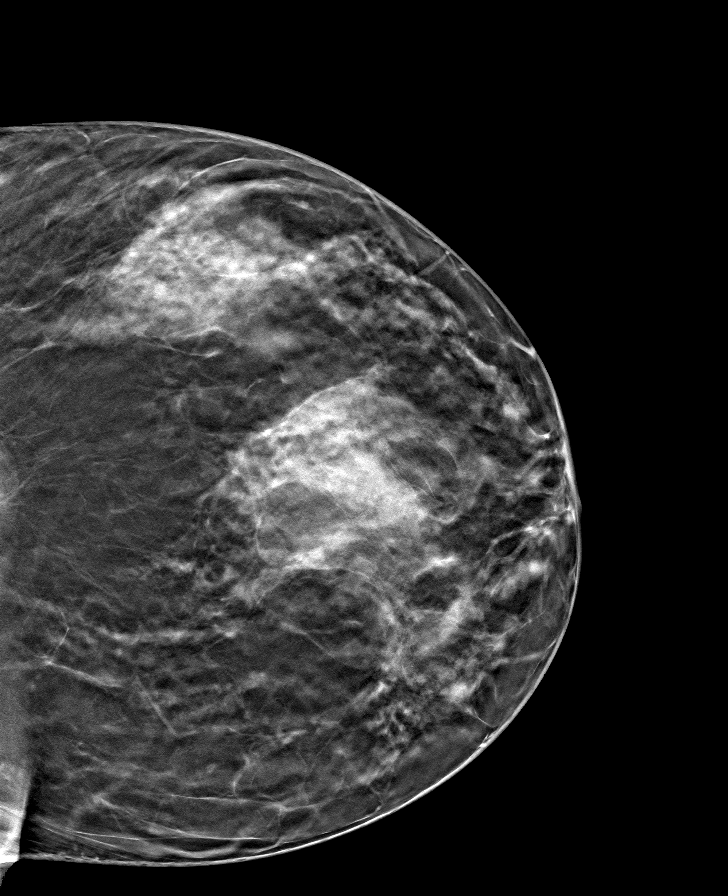

[R CC tomo · tomo slice 37/74.0]
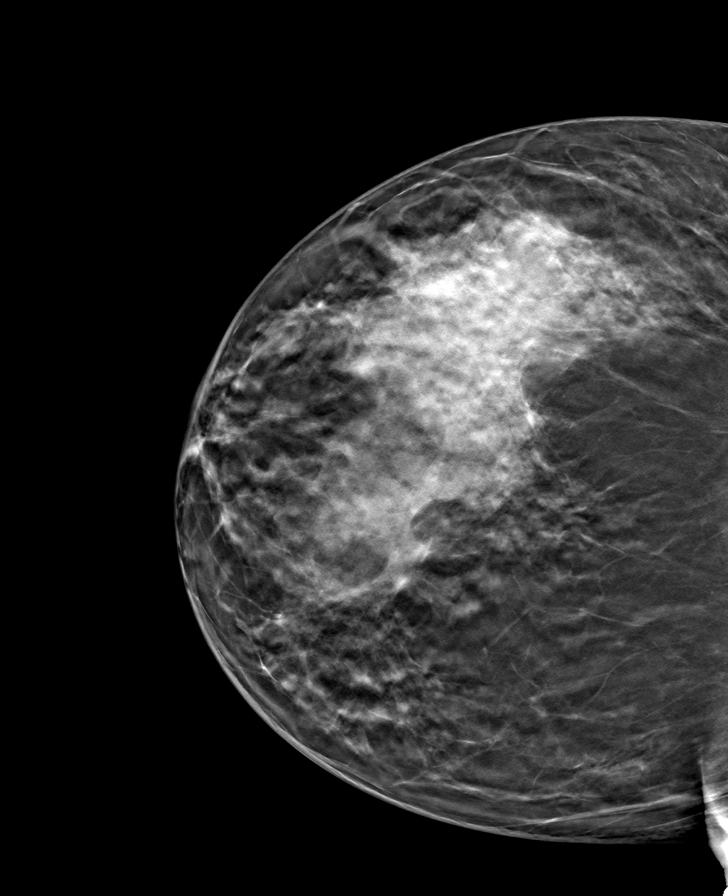

[R MLO tomo · tomo slice 46/91.0]
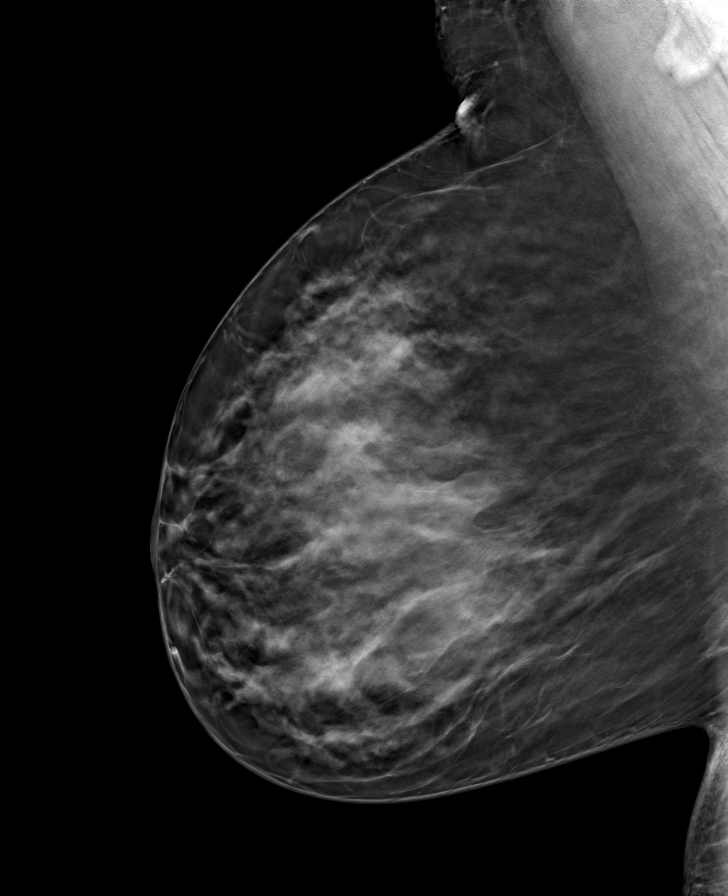

[8 of 24 positions shown; findings below may reference images not displayed]

ACR Breast Density Category c: The breast tissue is heterogeneously
dense, which may obscure small masses.
FINDINGS: There are no findings suspicious for malignancy. Images were
processed with CAD.
IMPRESSION: No mammographic evidence of malignancy. A result letter of this
screening mammogram will be mailed directly to the patient.

RECOMMENDATION:
Screening mammogram in one year. (Code:[id number]-U-LHB)

BI-RADS CATEGORY  1: Negative.

## 2020-12-16 ENCOUNTER — Encounter (HOSPITAL_BASED_OUTPATIENT_CLINIC_OR_DEPARTMENT_OTHER): Payer: Self-pay | Admitting: *Deleted

## 2020-12-16 ENCOUNTER — Emergency Department (HOSPITAL_BASED_OUTPATIENT_CLINIC_OR_DEPARTMENT_OTHER)
Admission: EM | Admit: 2020-12-16 | Discharge: 2020-12-16 | Disposition: A | Discharge status: Home / Self Care | Payer: 59 | Attending: Emergency Medicine | Admitting: Emergency Medicine

## 2020-12-16 ENCOUNTER — Other Ambulatory Visit: Payer: Self-pay

## 2020-12-16 DIAGNOSIS — R0981 Nasal congestion: Secondary | ICD-10-CM | POA: Insufficient documentation

## 2020-12-16 DIAGNOSIS — Z87891 Personal history of nicotine dependence: Secondary | ICD-10-CM | POA: Diagnosis not present

## 2020-12-16 DIAGNOSIS — R059 Cough, unspecified: Secondary | ICD-10-CM | POA: Diagnosis present

## 2020-12-16 DIAGNOSIS — U071 COVID-19: Secondary | ICD-10-CM | POA: Diagnosis not present

## 2020-12-16 LAB — RESP PANEL BY RT-PCR (FLU A&B, COVID) ARPGX2
Influenza A by PCR: NEGATIVE
Influenza B by PCR: NEGATIVE
SARS Coronavirus 2 by RT PCR: POSITIVE — AB

## 2020-12-16 NOTE — ED Triage Notes (Addendum)
Pt has had a cough x 1 week, tested a week ago and was neg for Covid. Pt states her daughter had a cold then her  symptoms started.    Lab called with Covid Positive results shile pt was waiting for room.

## 2020-12-16 NOTE — ED Provider Notes (Signed)
[facility] EMERGENCY DEPT Provider Note   CSN: [id number] Arrival date & time: 12/16/20  1729     History Chief Complaint  Patient presents with   Cough    Monica Washington is a 40 y.o. female.   Influenza Presenting symptoms: cough, headache and myalgias   Severity:  Moderate Onset quality:  Sudden Duration:  3 days Progression:  Worsening Chronicity:  New Relieved by:  Nothing Worsened by:  Nothing Ineffective treatments:  None tried Associated symptoms: chills, decreased appetite, decreased physical activity and nasal congestion   Associated symptoms: no ear pain, no mental status change, no neck stiffness and no syncope       History reviewed. No pertinent past medical history.  There are no problems to display for this patient.   History reviewed. No pertinent surgical history.   OB History   No obstetric history on file.     Family History  Problem Relation Age of Onset   Healthy Mother    Diabetes Father    Breast cancer Maternal Aunt    Colon cancer Neg Hx     Social History   Tobacco Use   Smoking status: Former    Types: Cigarettes    Quit date: 01/19/2013    Years since quitting: 7.9   Smokeless tobacco: Never  Vaping Use   Vaping Use: Every day  Substance Use Topics   Alcohol use: Yes    Comment: very rare   Drug use: Never    Home Medications Prior to Admission medications   Not on File    Allergies    Patient has no known allergies.  Review of Systems   Review of Systems  Constitutional:  Positive for chills and decreased appetite.  HENT:  Positive for congestion. Negative for ear pain.   Respiratory:  Positive for cough.   Musculoskeletal:  Positive for myalgias. Negative for neck stiffness.  Neurological:  Positive for headaches.   Physical Exam Updated Vital Signs BP 135/85 (BP Location: Left Arm)   Pulse 98   Temp 99 F (37.2 C)   Resp 17   Ht 5\' 6"  (1.676 m)   Wt 101.2 kg   LMP  12/11/2020   SpO2 100%   BMI 35.99 kg/m   Physical Exam Vitals and nursing note reviewed.  Constitutional:      General: She is not in acute distress.    Appearance: She is well-developed. She is ill-appearing. She is not toxic-appearing or diaphoretic.  HENT:     Head: Normocephalic and atraumatic.     Right Ear: External ear normal.     Left Ear: External ear normal.     Nose: Nose normal.     Mouth/Throat:     Mouth: Mucous membranes are moist.  Eyes:     General: No scleral icterus.    Conjunctiva/sclera: Conjunctivae normal.  Cardiovascular:     Rate and Rhythm: Normal rate and regular rhythm.     Heart sounds: Normal heart sounds. No murmur heard.   No friction rub. No gallop.  Pulmonary:     Effort: Pulmonary effort is normal. No respiratory distress.     Breath sounds: Normal breath sounds. No wheezing.  Abdominal:     General: Bowel sounds are normal. There is no distension.     Palpations: Abdomen is soft. There is no mass.     Tenderness: There is no abdominal tenderness. There is no guarding.  Musculoskeletal:     Cervical back: Normal range  of motion.  Skin:    General: Skin is warm and dry.  Neurological:     Mental Status: She is alert and oriented to person, place, and time.  Psychiatric:        Behavior: Behavior normal.    ED Results / Procedures / Treatments   Labs (all labs ordered are listed, but only abnormal results are displayed) Labs Reviewed  RESP PANEL BY RT-PCR (FLU A&B, COVID) [id number] - Abnormal; Notable for the following components:      Result Value   SARS Coronavirus 2 by RT PCR POSITIVE (*)    All other components within normal limits    EKG None  Radiology No results found.  Procedures Procedures   Medications Ordered in ED Medications - No data to display  ED Course  I have reviewed the triage vital signs and the nursing notes.  Pertinent labs & imaging results that were available during my care of the patient were  reviewed by me and considered in my medical decision making (see chart for details).    MDM Rules/Calculators/A&P                           Patient here with flu like sxs. Pos for Covid. No fever or hypoxia. HDS and afebrile. Will dc with supportive care, isolation guidelines and return precautions Final Clinical Impression(s) / ED Diagnoses Final diagnoses:  None    Rx / DC Orders ED Discharge Orders     None        Margarita Mail, PA-C 12/16/20 2050    Tegeler, Gwenyth Allegra, MD 12/16/20 [phone]

## 2020-12-16 NOTE — Discharge Instructions (Addendum)
Please isolate until Thursday Dec 1. Return for new or worsening symptoms. Take over the counter cold and flu medicine.

## 2021-03-04 ENCOUNTER — Encounter: Payer: Self-pay | Admitting: Physician Assistant

## 2021-03-04 ENCOUNTER — Ambulatory Visit: Payer: 59 | Admitting: Physician Assistant

## 2021-03-04 ENCOUNTER — Other Ambulatory Visit: Payer: Self-pay

## 2021-03-04 VITALS — BP 110/70 | HR 104 | Temp 98.2°F | Ht 66.0 in | Wt 225.0 lb

## 2021-03-04 DIAGNOSIS — H6121 Impacted cerumen, right ear: Secondary | ICD-10-CM | POA: Diagnosis not present

## 2021-03-04 DIAGNOSIS — M79674 Pain in right toe(s): Secondary | ICD-10-CM | POA: Diagnosis not present

## 2021-03-04 DIAGNOSIS — L309 Dermatitis, unspecified: Secondary | ICD-10-CM | POA: Diagnosis not present

## 2021-03-04 DIAGNOSIS — N644 Mastodynia: Secondary | ICD-10-CM | POA: Diagnosis not present

## 2021-03-04 NOTE — Patient Instructions (Signed)
It was great to see you!  Mammogram ordered -- you will be contacted about this by the [facility]  May use cotton ball soaked in mineral oil into ear 10-20 min per week if having recurrent ear wax accumulation. May use dilute hydrogen peroxide (equal parts this and water) and place a few drops into ear every 2 weeks to help break down wax buildup.  Start hydrocortisone cream over the counter (anti-itch) and apply to outer ear  I will put in referral for podiatry  Take care,  [name] PA-C

## 2021-03-04 NOTE — Progress Notes (Signed)
Monica Washington is a 41 y.o. female here for breast pain.  History of Present Illness:   Chief Complaint  Patient presents with   Breast Pain    Pt has been having pain left breast x 2 days. No discharge from the nipple.    HPI  Breast Pain  [name] presents with c/o intermittent left breast pain that has been onset for several years, worsened within past two days. Pt describes the pain as a sharp sensation that she has grown used to occurring infrequently, but due to an increase in frequency she became worried. She is unsure if she has developed any lumps in her breasts due to not being able to tell the difference between her dense breast tissue and a lump. She does admit that she is on the last day of her menstrual cycle and understands that this could be a side effect of her cycle, but states this feeling is much more intense.   Pt's last mammogram was 11/2019 at [facility].  Denies nipple discharge or concerns for pregnancy.    Right Toe Pain  [name] has also been experiencing a throbbing sensation in her right pinky toe for the past month. Initially she believed she had hit her toe on something without noticing it, but due to prolonged pain she believes something more could be occurring. In an effort to manage her pain she has been wearing comfortable shoes such as crocs and slides as well as using corn cushions but this has provided little relief. Denies drainage.    Dermatitis; Cerumen Buildup Pt reports that although she has been cleaning her ears daily with q-tips, she still feels as though she is not getting all of the wax cleared out. Additionally she is experiencing crusting on bilateral outer ears. According to pt, this problem has been onset for a couple of weeks and has become increasingly annoying and embarrassing.   Denies hearing loss, drainage, redness, or dizziness.  History reviewed. No pertinent past medical history.   Social History   Tobacco  Use   Smoking status: Former    Types: Cigarettes    Quit date: 01/19/2013    Years since quitting: 8.1   Smokeless tobacco: Never  Vaping Use   Vaping Use: Every day  Substance Use Topics   Alcohol use: Yes    Comment: very rare   Drug use: Never    History reviewed. No pertinent surgical history.  Family History  Problem Relation Age of Onset   Healthy Mother    Diabetes Father    Breast cancer Maternal Aunt    Colon cancer Neg Hx     No Known Allergies  Current Medications:  No current outpatient medications on file.   Review of Systems:   ROS Negative unless otherwise specified per HPI. Vitals:   Vitals:   03/04/21 1142  BP: 110/70  Pulse: (!) 104  Temp: 98.2 F (36.8 C)  TempSrc: Temporal  SpO2: 98%  Weight: 225 lb (102.1 kg)  Height: 5\' 6"  (1.676 m)     Body mass index is 36.32 kg/m.  Physical Exam:   Physical Exam Constitutional:      Appearance: Normal appearance. She is well-developed.  HENT:     Head: Normocephalic and atraumatic.     Right Ear: Ear canal and external ear normal.     Left Ear: Tympanic membrane, ear canal and external ear normal.     Ears:     Comments: Large amount hard wax  in R ear  Slight flaking of skin on L upper auricle Eyes:     General: Lids are normal.     Extraocular Movements: Extraocular movements intact.     Conjunctiva/sclera: Conjunctivae normal.  Pulmonary:     Effort: Pulmonary effort is normal.  Musculoskeletal:        General: Normal range of motion.     Cervical back: Normal range of motion and neck supple.  Feet:     Comments: Lateral aspect of R toe with callused skin TTP, no erythema or fluctuance Skin:    General: Skin is warm and dry.  Neurological:     Mental Status: She is alert and oriented to person, place, and time.  Psychiatric:        Attention and Perception: Attention and perception normal.        Mood and Affect: Mood normal.        Behavior: Behavior normal.        Thought  Content: Thought content normal.        Judgment: Judgment normal.   I removed wax with gentle use of lighted curette; no evidence of erythema to TM or canal after procedure  Assessment and Plan:   Breast pain Will order stat diag mammo I have asked patient to let us know if she has not heard anything about this appointment by the end of the week  Toe pain, right Possible corn Referral to podiatry for further evaluation and management  Excessive cerumen in ear canal, right Wax removed without any obvious evidence of infection/inflammation Strategies to prevent wax build-up on handout  Dermatitis Recommend OTC cortisone cream to area of irritation If new/worsening sx, follow-up as needed  I,Havlyn C Ratchford,acting as a scribe for [employer], PA.,have documented all relevant documentation on the behalf of [name], PA,as directed by  [name], PA while in the presence of [name], [place].  I, [name], [place], have reviewed all documentation for this visit. The documentation on 03/04/21 for the exam, diagnosis, procedures, and orders are all accurate and complete.   [name], PA-C

## 2021-03-06 ENCOUNTER — Other Ambulatory Visit: Payer: Self-pay | Admitting: Physician Assistant

## 2021-03-06 DIAGNOSIS — N644 Mastodynia: Secondary | ICD-10-CM

## 2021-03-14 ENCOUNTER — Other Ambulatory Visit: Payer: Self-pay

## 2021-03-14 ENCOUNTER — Telehealth: Payer: Self-pay

## 2021-03-14 DIAGNOSIS — M79674 Pain in right toe(s): Secondary | ICD-10-CM

## 2021-03-14 NOTE — Telephone Encounter (Signed)
Pt verified DOB, advised referral was placed and someone would be calling her to schedule an appt. Pt verbalized understanding

## 2021-03-14 NOTE — Telephone Encounter (Signed)
Patient states Monica Washington was going to refer her to a podiatrist.  Would like a call back in regard.

## 2021-03-19 ENCOUNTER — Ambulatory Visit
Admission: RE | Admit: 2021-03-19 | Discharge: 2021-03-19 | Disposition: A | Discharge status: Home / Self Care | Payer: 59 | Source: Ambulatory Visit | Attending: Physician Assistant | Admitting: Physician Assistant

## 2021-03-19 ENCOUNTER — Other Ambulatory Visit: Payer: Self-pay

## 2021-03-19 ENCOUNTER — Ambulatory Visit: Payer: 59

## 2021-03-19 ENCOUNTER — Ambulatory Visit: Admission: RE | Admit: 2021-03-19 | Payer: 59 | Source: Ambulatory Visit

## 2021-03-19 DIAGNOSIS — N644 Mastodynia: Secondary | ICD-10-CM

## 2021-03-21 ENCOUNTER — Encounter: Payer: Self-pay | Admitting: Podiatry

## 2021-03-21 ENCOUNTER — Other Ambulatory Visit: Payer: Self-pay

## 2021-03-21 ENCOUNTER — Ambulatory Visit: Payer: 59 | Admitting: Podiatry

## 2021-03-21 ENCOUNTER — Ambulatory Visit (INDEPENDENT_AMBULATORY_CARE_PROVIDER_SITE_OTHER): Payer: 59

## 2021-03-21 DIAGNOSIS — L84 Corns and callosities: Secondary | ICD-10-CM | POA: Diagnosis not present

## 2021-03-21 DIAGNOSIS — D169 Benign neoplasm of bone and articular cartilage, unspecified: Secondary | ICD-10-CM | POA: Diagnosis not present

## 2021-03-21 DIAGNOSIS — M2041 Other hammer toe(s) (acquired), right foot: Secondary | ICD-10-CM | POA: Diagnosis not present

## 2021-03-23 NOTE — Progress Notes (Signed)
Subjective:  ? ?Patient ID: Monica Washington, female   DOB: 41 y.o.   MRN: [id number]  ? ?HPI ?Patient presents with structural abnormality of the fifth digit right foot with chronic lesion formation and pain with slight on the left not to the same degree.  Patient does not smoke likes to be active ? ? ?Review of Systems  ?All other systems reviewed and are negative. ? ? ?   ?Objective:  ?Physical Exam ?Vitals and nursing note reviewed.  ?Constitutional:   ?   Appearance: She is well-developed.  ?Pulmonary:  ?   Effort: Pulmonary effort is normal.  ?Musculoskeletal:     ?   General: Normal range of motion.  ?Skin: ?   General: Skin is warm.  ?Neurological:  ?   Mental Status: She is alert.  ?  ?Neurovascular status found to be intact muscle strength found to be adequate range of motion adequate.  Patient has significant rotation fifth digit right over left with distal lateral keratotic lesion formation right over left that is painful.  Good digital perfusion well oriented ? ?   ?Assessment:  ?Hammertoe deformity with distal rotation digits 5 bilateral right over left along with distal lateral keratotic exostotic lesion that is very painful ? ?   ?Plan:  ?H&P reviewed condition and recommended conservative treatment.  Today I went ahead did sharp sterile deep debridement of the lesion I then educated her on possibility for derotational arthroplasty distal lateral exostectomy explaining to her procedure recovery ? ?X-rays indicate that there is significant distal rotation digit 5 right foot with exostotic distal lateral lesion ?   ? ? ?

## 2021-06-19 DIAGNOSIS — Z419 Encounter for procedure for purposes other than remedying health state, unspecified: Secondary | ICD-10-CM | POA: Diagnosis not present

## 2021-07-10 ENCOUNTER — Ambulatory Visit: Payer: 59 | Admitting: Physician Assistant

## 2021-07-10 ENCOUNTER — Encounter: Payer: Self-pay | Admitting: Physician Assistant

## 2021-07-10 VITALS — BP 120/76 | HR 82 | Temp 98.0°F | Ht 66.5 in | Wt 219.5 lb

## 2021-07-10 DIAGNOSIS — Z0000 Encounter for general adult medical examination without abnormal findings: Secondary | ICD-10-CM | POA: Diagnosis not present

## 2021-07-10 DIAGNOSIS — F419 Anxiety disorder, unspecified: Secondary | ICD-10-CM | POA: Diagnosis not present

## 2021-07-10 DIAGNOSIS — E785 Hyperlipidemia, unspecified: Secondary | ICD-10-CM | POA: Diagnosis not present

## 2021-07-10 DIAGNOSIS — F32A Depression, unspecified: Secondary | ICD-10-CM

## 2021-07-10 DIAGNOSIS — E669 Obesity, unspecified: Secondary | ICD-10-CM | POA: Diagnosis not present

## 2021-07-10 LAB — CBC WITH DIFFERENTIAL/PLATELET
Basophils Absolute: 0 10*3/uL (ref 0.0–0.1)
Basophils Relative: 0.3 % (ref 0.0–3.0)
Eosinophils Absolute: 0.1 10*3/uL (ref 0.0–0.7)
Eosinophils Relative: 1.3 % (ref 0.0–5.0)
HCT: 39.5 % (ref 36.0–46.0)
Hemoglobin: 12.7 g/dL (ref 12.0–15.0)
Lymphocytes Relative: 25.2 % (ref 12.0–46.0)
Lymphs Abs: 2.2 10*3/uL (ref 0.7–4.0)
MCHC: 32.1 g/dL (ref 30.0–36.0)
MCV: 87.1 fl (ref 78.0–100.0)
Monocytes Absolute: 0.8 10*3/uL (ref 0.1–1.0)
Monocytes Relative: 8.9 % (ref 3.0–12.0)
Neutro Abs: 5.6 10*3/uL (ref 1.4–7.7)
Neutrophils Relative %: 64.3 % (ref 43.0–77.0)
Platelets: 300 10*3/uL (ref 150.0–400.0)
RBC: 4.54 Mil/uL (ref 3.87–5.11)
RDW: 15 % (ref 11.5–15.5)
WBC: 8.7 10*3/uL (ref 4.0–10.5)

## 2021-07-10 LAB — COMPREHENSIVE METABOLIC PANEL
ALT: 11 U/L (ref 0–35)
AST: 14 U/L (ref 0–37)
Albumin: 4 g/dL (ref 3.5–5.2)
Alkaline Phosphatase: 39 U/L (ref 39–117)
BUN: 11 mg/dL (ref 6–23)
CO2: 30 mEq/L (ref 19–32)
Calcium: 9 mg/dL (ref 8.4–10.5)
Chloride: 105 mEq/L (ref 96–112)
Creatinine, Ser: 0.88 mg/dL (ref 0.40–1.20)
GFR: 80.76 mL/min (ref >60.00)
Glucose, Bld: 109 mg/dL — ABNORMAL HIGH (ref 70–99)
Potassium: 3.7 mEq/L (ref 3.5–5.1)
Sodium: 138 mEq/L (ref 135–145)
Total Bilirubin: 0.6 mg/dL (ref 0.2–1.2)
Total Protein: 7.4 g/dL (ref 6.0–8.3)

## 2021-07-10 LAB — LIPID PANEL
Cholesterol: 161 mg/dL (ref 0–200)
HDL: 55.7 mg/dL (ref >39.00)
LDL Cholesterol: 96 mg/dL (ref 0–99)
NonHDL: 105.36
Total CHOL/HDL Ratio: 3
Triglycerides: 49 mg/dL (ref 0.0–149.0)
VLDL: 9.8 mg/dL (ref 0.0–40.0)

## 2021-07-10 MED ORDER — TOPIRAMATE 25 MG PO TABS: 25.0000 mg | ORAL_TABLET | Freq: Every day | ORAL | 0 refills | Status: DC

## 2021-07-10 MED ORDER — PHENTERMINE HCL 37.5 MG PO TABS: 37.5000 mg | ORAL_TABLET | Freq: Every day | ORAL | 2 refills | Status: DC

## 2021-07-10 NOTE — Patient Instructions (Addendum)
It was great to see you!  Start phentermine and topamax --> follow-up in 3 months if you would like to continue this   Please go to the lab for blood work.   Our office will call you with your results unless you have chosen to receive results via MyChart.  If your blood work is normal we will follow-up each year for physicals and as scheduled for chronic medical problems.  If anything is abnormal we will treat accordingly and get you in for a follow-up.  Take care,  Aldona Bar

## 2021-07-19 DIAGNOSIS — Z419 Encounter for procedure for purposes other than remedying health state, unspecified: Secondary | ICD-10-CM | POA: Diagnosis not present

## 2021-08-19 DIAGNOSIS — Z419 Encounter for procedure for purposes other than remedying health state, unspecified: Secondary | ICD-10-CM | POA: Diagnosis not present

## 2021-08-20 DIAGNOSIS — Z20822 Contact with and (suspected) exposure to covid-19: Secondary | ICD-10-CM | POA: Diagnosis not present

## 2021-09-19 DIAGNOSIS — Z419 Encounter for procedure for purposes other than remedying health state, unspecified: Secondary | ICD-10-CM | POA: Diagnosis not present

## 2021-10-15 ENCOUNTER — Other Ambulatory Visit: Payer: Self-pay | Admitting: Physician Assistant

## 2021-10-19 DIAGNOSIS — Z419 Encounter for procedure for purposes other than remedying health state, unspecified: Secondary | ICD-10-CM | POA: Diagnosis not present

## 2021-11-19 DIAGNOSIS — Z419 Encounter for procedure for purposes other than remedying health state, unspecified: Secondary | ICD-10-CM | POA: Diagnosis not present

## 2021-11-27 ENCOUNTER — Ambulatory Visit: Payer: 59 | Admitting: Internal Medicine

## 2021-11-27 ENCOUNTER — Encounter: Payer: Self-pay | Admitting: Internal Medicine

## 2021-11-27 VITALS — BP 102/70 | HR 93 | Temp 98.4°F | Resp 16 | Ht 66.5 in | Wt 207.0 lb

## 2021-11-27 DIAGNOSIS — M5410 Radiculopathy, site unspecified: Secondary | ICD-10-CM | POA: Diagnosis not present

## 2021-11-27 HISTORY — DX: Radiculopathy, site unspecified: M54.10

## 2021-11-27 MED ORDER — CYCLOBENZAPRINE HCL 10 MG PO TABS: 10.0000 mg | ORAL_TABLET | Freq: Three times a day (TID) | ORAL | 0 refills | Status: DC | PRN

## 2021-11-27 MED ORDER — TRAMADOL HCL 50 MG PO TABS: 50.0000 mg | ORAL_TABLET | Freq: Three times a day (TID) | ORAL | 0 refills | Status: AC | PRN

## 2021-11-27 MED ORDER — KETOROLAC TROMETHAMINE 10 MG PO TABS: 10.0000 mg | ORAL_TABLET | Freq: Four times a day (QID) | ORAL | 0 refills | Status: DC | PRN

## 2021-11-27 MED ORDER — KETOROLAC TROMETHAMINE 60 MG/2ML IM SOLN
60.0000 mg | Freq: Once | INTRAMUSCULAR | Status: AC
  Administered 2021-11-27: 60 mg via INTRAMUSCULAR

## 2021-11-27 NOTE — Patient Instructions (Signed)
It was a pleasure seeing you today! I truly hope you feel like you received 5 star service and please let me know if there is anything I can improve.  Loralee Pacas, MD   Today the plan is... try the exercises and a memory foam topper and kicking others out of your bed.  Please do not mix alcohol or sedative with cyclobenzaprine or tramadol as these are sedating and cause bad reaction... so u maybe should avoid for work. Avoid driving on.  Please go to our [facility] office to get your xrays done. You can walk in M-F between 8:30am- noon or 1pm - 5pm. Tell them you are there for xrays ordered by me. They will send me the results, then I will let you know the results with instructions.   Address: [address]  The Xray department is located in the basement.    Acute back pain with radiculopathy -     DG Lumb Spine Flex&Ext Only; Future -     DG Cervical Spine 2 or 3 views; Future -     Ketorolac Tromethamine; Take 1 tablet (10 mg total) by mouth every 6 (six) hours as needed.  Dispense: 20 tablet; Refill: 0 -     Ketorolac Tromethamine -     Cyclobenzaprine HCl; Take 1 tablet (10 mg total) by mouth 3 (three) times daily as needed for muscle spasms.  Dispense: 30 tablet; Refill: 0 -     traMADol HCl; Take 1 tablet (50 mg total) by mouth every 8 (eight) hours as needed for up to 5 days.  Dispense: 15 tablet; Refill: 0    Acute Back Pain, Adult Acute back pain is sudden and usually short-lived. It is often caused by an injury to the muscles and tissues in the back. The injury may result from: A muscle, tendon, or ligament getting overstretched or torn. Ligaments are tissues that connect bones to each other. Lifting something improperly can cause a back strain. Wear and tear (degeneration) of the spinal disks. Spinal disks are circular tissue that provide cushioning between the bones of the spine (vertebrae). Twisting motions, such as while playing sports or doing yard  work. A hit to the back. Arthritis. You may have a physical exam, lab tests, and imaging tests to find the cause of your pain. Acute back pain usually goes away with rest and home care. Follow these instructions at home: Managing pain, stiffness, and swelling Take over-the-counter and prescription medicines only as told by your health care provider. Treatment may include medicines for pain and inflammation that are taken by mouth or applied to the skin, or muscle relaxants. Your health care provider may recommend applying ice during the first 24-48 hours after your pain starts. To do this: Put ice in a plastic bag. Place a towel between your skin and the bag. Leave the ice on for 20 minutes, 2-3 times a day. Remove the ice if your skin turns bright red. This is very important. If you cannot feel pain, heat, or cold, you have a greater risk of damage to the area. If directed, apply heat to the affected area as often as told by your health care provider. Use the heat source that your health care provider recommends, such as a moist heat pack or a heating pad. Place a towel between your skin and the heat source. Leave the heat on for 20-30 minutes. Remove the heat if your skin turns bright red. This is  especially important if you are unable to feel pain, heat, or cold. You have a greater risk of getting burned. Activity  Do not stay in bed. Staying in bed for more than 1-2 days can delay your recovery. Sit up and stand up straight. Avoid leaning forward when you sit or hunching over when you stand. If you work at a desk, sit close to it so you do not need to lean over. Keep your chin tucked in. Keep your neck drawn back, and keep your elbows bent at a 90-degree angle (right angle). Sit high and close to the steering wheel when you drive. Add lower back (lumbar) support to your car seat, if needed. Take short walks on even surfaces as soon as you are able. Try to increase the length of time you  walk each day. Do not sit, drive, or stand in one place for more than 30 minutes at a time. Sitting or standing for long periods of time can put stress on your back. Do not drive or use heavy machinery while taking prescription pain medicine. Use proper lifting techniques. When you bend and lift, use positions that put less stress on your back: [place] your knees. Keep the load close to your body. Avoid twisting. Exercise regularly as told by your health care provider. Exercising helps your back heal faster and helps prevent back injuries by keeping muscles strong and flexible. Work with a physical therapist to make a safe exercise program, as recommended by your health care provider. Do any exercises as told by your physical therapist. Lifestyle Maintain a healthy weight. Extra weight puts stress on your back and makes it difficult to have good posture. Avoid activities or situations that make you feel anxious or stressed. Stress and anxiety increase muscle tension and can make back pain worse. Learn ways to manage anxiety and stress, such as through exercise. General instructions Sleep on a firm mattress in a comfortable position. Try lying on your side with your knees slightly bent. If you lie on your back, put a pillow under your knees. Keep your head and neck in a straight line with your spine (neutral position) when using electronic equipment like smartphones or pads. To do this: Raise your smartphone or pad to look at it instead of bending your head or neck to look down. Put the smartphone or pad at the level of your face while looking at the screen. Follow your treatment plan as told by your health care provider. This may include: Cognitive or behavioral therapy. Acupuncture or massage therapy. Meditation or yoga. Contact a health care provider if: You have pain that is not relieved with rest or medicine. You have increasing pain going down into your legs or buttocks. Your pain does not  improve after 2 weeks. You have pain at night. You lose weight without trying. You have a fever or chills. You develop nausea or vomiting. You develop abdominal pain. Get help right away if: You develop new bowel or bladder control problems. You have unusual weakness or numbness in your arms or legs. You feel faint. These symptoms may represent a serious problem that is an emergency. Do not wait to see if the symptoms will go away. Get medical help right away. Call your local emergency services (911 in the [place] Do not drive yourself to the hospital. Summary Acute back pain is sudden and usually short-lived. Use proper lifting techniques. When you bend and lift, use positions that put less stress on your back. Take  over-the-counter and prescription medicines only as told by your health care provider, and apply heat or ice as told. This information is not intended to replace advice given to you by your health care provider. Make sure you discuss any questions you have with your health care provider. Document Revised: 03/29/2020 Document Reviewed: 03/29/2020 Elsevier Patient Education  [place].    Back Exercises The following exercises strengthen the muscles that help to support the trunk (torso) and back. They also help to keep the lower back flexible. Doing these exercises can help to prevent or lessen existing low back pain. If you have back pain or discomfort, try doing these exercises 2-3 times each day or as told by your health care provider. As your pain improves, do them once each day, but increase the number of times that you repeat the steps for each exercise (do more repetitions). To prevent the recurrence of back pain, continue to do these exercises once each day or as told by your health care provider. Do exercises exactly as told by your health care provider and adjust them as directed. It is normal to feel mild stretching, pulling, tightness, or discomfort as you do  these exercises, but you should stop right away if you feel sudden pain or your pain gets worse. Exercises Single knee to chest Repeat these steps 3-5 times for each leg: Lie on your back on a firm bed or the floor with your legs extended. Bring one knee to your chest. Your other leg should stay extended and in contact with the floor. Hold your knee in place by grabbing your knee or thigh with both hands and hold. Pull on your knee until you feel a gentle stretch in your lower back or buttocks. Hold the stretch for 10-30 seconds. Slowly release and straighten your leg.  Pelvic tilt Repeat these steps 5-10 times: Lie on your back on a firm bed or the floor with your legs extended. Bend your knees so they are pointing toward the ceiling and your feet are flat on the floor. Tighten your lower abdominal muscles to press your lower back against the floor. This motion will tilt your pelvis so your tailbone points up toward the ceiling instead of pointing to your feet or the floor. With gentle tension and even breathing, hold this position for 5-10 seconds.  Cat-cow Repeat these steps until your lower back becomes more flexible: Get into a hands-and-knees position on a firm bed or the floor. Keep your hands under your shoulders, and keep your knees under your hips. You may place padding under your knees for comfort. Let your head hang down toward your chest. Contract your abdominal muscles and point your tailbone toward the floor so your lower back becomes rounded like the back of a cat. Hold this position for 5 seconds. Slowly lift your head, let your abdominal muscles relax, and point your tailbone up toward the ceiling so your back forms a sagging arch like the back of a cow. Hold this position for 5 seconds.  Press-ups Repeat these steps 5-10 times: Lie on your abdomen (face-down) on a firm bed or the floor. Place your palms near your head, about shoulder-width apart. Keeping your back as  relaxed as possible and keeping your hips on the floor, slowly straighten your arms to raise the top half of your body and lift your shoulders. Do not use your back muscles to raise your upper torso. You may adjust the placement of your hands to  make yourself more comfortable. Hold this position for 5 seconds while you keep your back relaxed. Slowly return to lying flat on the floor.  Bridges Repeat these steps 10 times: Lie on your back on a firm bed or the floor. Bend your knees so they are pointing toward the ceiling and your feet are flat on the floor. Your arms should be flat at your sides, next to your body. Tighten your buttocks muscles and lift your buttocks off the floor until your waist is at almost the same height as your knees. You should feel the muscles working in your buttocks and the back of your thighs. If you do not feel these muscles, slide your feet 1-2 inches (2.5-5 cm) farther away from your buttocks. Hold this position for 3-5 seconds. Slowly lower your hips to the starting position, and allow your buttocks muscles to relax completely. If this exercise is too easy, try doing it with your arms crossed over your chest. Abdominal crunches Repeat these steps 5-10 times: Lie on your back on a firm bed or the floor with your legs extended. Bend your knees so they are pointing toward the ceiling and your feet are flat on the floor. Cross your arms over your chest. Tip your chin slightly toward your chest without bending your neck. Tighten your abdominal muscles and slowly raise your torso high enough to lift your shoulder blades a tiny bit off the floor. Avoid raising your torso higher than that because it can put too much stress on your lower back and does not help to strengthen your abdominal muscles. Slowly return to your starting position.  Back lifts Repeat these steps 5-10 times: Lie on your abdomen (face-down) with your arms at your sides, and rest your forehead on the  floor. Tighten the muscles in your legs and your buttocks. Slowly lift your chest off the floor while you keep your hips pressed to the floor. Keep the back of your head in line with the curve in your back. Your eyes should be looking at the floor. Hold this position for 3-5 seconds. Slowly return to your starting position.  Contact a health care provider if: Your back pain or discomfort gets much worse when you do an exercise. Your worsening back pain or discomfort does not lessen within 2 hours after you exercise. If you have any of these problems, stop doing these exercises right away. Do not do them again unless your health care provider says that you can. Get help right away if: You develop sudden, severe back pain. If this happens, stop doing the exercises right away. Do not do them again unless your health care provider says that you can. This information is not intended to replace advice given to you by your health care provider. Make sure you discuss any questions you have with your health care provider. Document Revised: 07/02/2020 Document Reviewed: 03/20/2020 Elsevier Patient Education  [place].     '[x]'$  RETURN TO CLINIC: No follow-ups on file.   - If you are not doing well: RETURN to the office sooner. - Please bring all your medicines to each appointment.  - If your condition begins to worsen or become severe:  GO to the ER.  '[x]'$  QUESTIONS/CONCERNS:  If you have follow-up questions / concerns:  - CLINICAL: please contact me via phone [phone] OR MyChart messaging  - [place] you will be contacted with the lab results as soon as they are available. For any  labs or imaging tests, we will call you if the results are significantly abnormal.  Most normal results will be posted to myChart as soon as they are available and I will comment on them there within 2-3 business days.  The fastest way to get your results is to activate your My Chart account.  Instructions are located on the last page of this paperwork. If you have not heard from [place] regarding the results in 2 weeks, please contact this office.  - BILLING: xray and lab orders are billed from separate companies and questions./concerns should be directed to the [facility].  For visit charges please discuss with our administrative services

## 2021-11-27 NOTE — Progress Notes (Signed)
[facility]:  [phone]   Routine Medical Office Visit  Patient:  Monica Washington      Age: 41 y.o.       Sex:  female  Date:   11/27/2021  PCP:    [name], [place] Provider: Loralee Pacas, MD  Assessment/Plan:   Zarinah was seen today for lower back pain.  Acute back pain with radiculopathy Assessment & Plan: Given this patient's history and physical exam, the most likely cause for her back pain is strain, from sleeping wrong and moving around day before, and likely pinched nerves.  I have considered alternative diagnoses.  In my medical opinion, she has a very low likelihood of having one of the following serious causes of back pain: bone tumor, acute bone fracture, aortic aneurysm, vertebral disk infection, or pyelonephritis.    The lumbar radiculopathy is much worse than the cervical radiculopathy  For acute pain, rest, intermittent application of cold packs (later, may switch to heat, but do not sleep on heating pad), analgesics and muscle relaxants are recommended.  For more chronic pain, take as needed NSAID's (with plenty of fluids)  Proper lifting with avoidance of heavy lifting discussed.  Gave her handout (AVS back pain smarttext) for how to do stretches and discussed using it for a home back care exercise program with flexion exercise routine.  Physical Therapy and Xray offered and/or ordered. she  will call or return to clinic prn if these symptoms worsen or fail to improve as anticipated.      Orders: -     DG Lumb Spine Flex&Ext Only; Future -     DG Cervical Spine 2 or 3 views; Future -     Ketorolac Tromethamine; Take 1 tablet (10 mg total) by mouth every 6 (six) hours as needed.  Dispense: 20 tablet; Refill: 0 -     Ketorolac Tromethamine -     Cyclobenzaprine HCl; Take 1 tablet (10 mg total) by mouth 3 (three) times daily as needed for muscle spasms.  Dispense: 30 tablet; Refill: 0 -     traMADol HCl; Take  1 tablet (50 mg total) by mouth every 8 (eight) hours as needed for up to 5 days.  Dispense: 15 tablet; Refill: 0     Today's key discussion points - also in After Visit Summary (AVS) Common side effects, risks, benefits, and alternatives for medications and treatment plan prescribed today were discussed, and she expressed understanding of the given instructions.  Advised heartburn meds for the toradol and driving/working precautions with cyclobenzaprine and tramadol.  She was encouraged to contact our office by phone or message via MyChart if she has any questions or concerns regarding our treatment plan (see AVS).  We discussed red flag symptoms and signs in detail and when to call the office or go to ER if her condition worsens (see AFTER VISIT SUMMARY). She expressed understanding.  No barriers to understanding were identified     Subjective:   Monica Washington is a 41 y.o. female with PMH significant for: Past Medical History:  Diagnosis Date   Acute back pain with radiculopathy 11/27/2021     She is presenting today with: Chief Complaint  Patient presents with   Lower back pain    Right above buttocks area. Started Sunday morning,doesn't recall doing anything to cause this. Heat packs, tylenol and ibuprofen with no relief. Unable to stand or sit for long periods of time.  Additional physician-collected and physician-reviewed history: See Assessment/Plan section for today's problem updates to charted history (under Overview: and Assessment & Plan for each problem)  The pain started in her low back and in her neck on Sunday when she woke up and she thought maybe she slept on it wrong.  She denies any history of trauma or even heavy lifting in the preceding timeframes.  She had been running around a lot around town with errands on the day before.  She has no history of heavy trauma or sports or car wreck to explain the pain.  The pain is mostly located around her L5 para spinal region  and C7 paraspinal region and it goes down her right buttocks to her upper right leg and down her left arm. Constipation is chronic has any weakness in the legs or loss of bowel or bladder function or groin numbness.  Review of Systems  Constitutional:  Negative for chills, diaphoresis, fever, malaise/fatigue and weight loss.  HENT:  Negative for congestion, ear discharge, ear pain, hearing loss, nosebleeds, sinus pain, sore throat and tinnitus.   Eyes:  Negative for blurred vision, double vision, photophobia, pain, discharge and redness.  Respiratory:  Negative for cough, hemoptysis, sputum production, shortness of breath, wheezing and stridor.   Cardiovascular:  Negative for chest pain, palpitations, orthopnea, claudication, leg swelling and PND.  Gastrointestinal:  Positive for constipation (chronic). Negative for abdominal pain, blood in stool, diarrhea, heartburn, melena, nausea and vomiting.  Genitourinary:  Negative for dysuria, flank pain, frequency, hematuria and urgency.  Musculoskeletal:  Negative for back pain, falls, joint pain, myalgias and neck pain.  Skin:  Negative for itching and rash.  Neurological:  Negative for dizziness, tingling, tremors, sensory change, speech change, focal weakness, seizures, loss of consciousness, weakness and headaches.  Endo/Heme/Allergies:  Negative for environmental allergies and polydipsia. Does not bruise/bleed easily.  Psychiatric/Behavioral:  Negative for depression, hallucinations, memory loss, substance abuse and suicidal ideas. The patient is not nervous/anxious and does not have insomnia.             Objective:  Physical Exam: BP 102/70 (BP Location: Left Arm, Patient Position: Sitting)   Pulse 93   Temp 98.4 F (36.9 C) (Temporal)   Resp 16   Ht 5' 6.5" (1.689 m)   Wt 207 lb (93.9 kg)   SpO2 99%   BMI 32.91 kg/m   She is a polite, friendly, and genuine person Constitutional: NAD, AAO, not ill-appearing Neuro: alert, no focal  deficit obvious, articulate speech Psych: normal mood, behavior, thought content  Problem-specific physical exam findings:  Paraspinal tenderness / pain at l5 and c7 regions.

## 2021-11-27 NOTE — Assessment & Plan Note (Signed)
Given this patient's history and physical exam, the most likely cause for her back pain is strain, from sleeping wrong and moving around day before, and likely pinched nerves.  I have considered alternative diagnoses.  In my medical opinion, she has a very low likelihood of having one of the following serious causes of back pain: bone tumor, acute bone fracture, aortic aneurysm, vertebral disk infection, or pyelonephritis.    The lumbar radiculopathy is much worse than the cervical radiculopathy  For acute pain, rest, intermittent application of cold packs (later, may switch to heat, but do not sleep on heating pad), analgesics and muscle relaxants are recommended.  For more chronic pain, take as needed NSAID's (with plenty of fluids)  Proper lifting with avoidance of heavy lifting discussed.  Gave her handout (AVS back pain smarttext) for how to do stretches and discussed using it for a home back care exercise program with flexion exercise routine.  Physical Therapy and Xray offered and/or ordered. she  will call or return to clinic prn if these symptoms worsen or fail to improve as anticipated.

## 2021-12-08 ENCOUNTER — Other Ambulatory Visit: Payer: Self-pay | Admitting: Physician Assistant

## 2021-12-08 NOTE — Telephone Encounter (Signed)
Pt requesting refill for Phentermine. Last OV 07/10/2021.

## 2021-12-19 DIAGNOSIS — Z419 Encounter for procedure for purposes other than remedying health state, unspecified: Secondary | ICD-10-CM | POA: Diagnosis not present

## 2022-01-19 DIAGNOSIS — Z419 Encounter for procedure for purposes other than remedying health state, unspecified: Secondary | ICD-10-CM | POA: Diagnosis not present

## 2022-02-19 DIAGNOSIS — Z419 Encounter for procedure for purposes other than remedying health state, unspecified: Secondary | ICD-10-CM | POA: Diagnosis not present

## 2022-03-20 DIAGNOSIS — Z419 Encounter for procedure for purposes other than remedying health state, unspecified: Secondary | ICD-10-CM | POA: Diagnosis not present

## 2022-03-24 ENCOUNTER — Telehealth: Payer: Self-pay | Admitting: Physician Assistant

## 2022-03-24 ENCOUNTER — Other Ambulatory Visit: Payer: Self-pay

## 2022-03-24 ENCOUNTER — Emergency Department (HOSPITAL_COMMUNITY)
Admission: EM | Admit: 2022-03-24 | Discharge: 2022-03-24 | Disposition: A | Discharge status: Home / Self Care | Payer: 59 | Attending: Emergency Medicine | Admitting: Emergency Medicine

## 2022-03-24 ENCOUNTER — Encounter (HOSPITAL_COMMUNITY): Payer: Self-pay | Admitting: Emergency Medicine

## 2022-03-24 ENCOUNTER — Emergency Department (HOSPITAL_COMMUNITY): Payer: 59

## 2022-03-24 DIAGNOSIS — I493 Ventricular premature depolarization: Secondary | ICD-10-CM | POA: Diagnosis not present

## 2022-03-24 DIAGNOSIS — E876 Hypokalemia: Secondary | ICD-10-CM | POA: Diagnosis not present

## 2022-03-24 DIAGNOSIS — R002 Palpitations: Secondary | ICD-10-CM | POA: Diagnosis present

## 2022-03-24 LAB — BASIC METABOLIC PANEL
Anion gap: 6 (ref 5–15)
BUN: 10 mg/dL (ref 6–20)
CO2: 28 mmol/L (ref 22–32)
Calcium: 8.5 mg/dL — ABNORMAL LOW (ref 8.9–10.3)
Chloride: 104 mmol/L (ref 98–111)
Creatinine, Ser: 0.91 mg/dL (ref 0.44–1.00)
GFR, Estimated: >60 mL/min (ref >60)
Glucose, Bld: 115 mg/dL — ABNORMAL HIGH (ref 70–99)
Potassium: 3.3 mmol/L — ABNORMAL LOW (ref 3.5–5.1)
Sodium: 138 mmol/L (ref 135–145)

## 2022-03-24 LAB — CBC
HCT: 38.7 % (ref 36.0–46.0)
Hemoglobin: 12.3 g/dL (ref 12.0–15.0)
MCH: 27.6 pg (ref 26.0–34.0)
MCHC: 31.8 g/dL (ref 30.0–36.0)
MCV: 86.8 fL (ref 80.0–100.0)
Platelets: 281 10*3/uL (ref 150–400)
RBC: 4.46 MIL/uL (ref 3.87–5.11)
RDW: 14.3 % (ref 11.5–15.5)
WBC: 7.9 10*3/uL (ref 4.0–10.5)
nRBC: 0 % (ref 0.0–0.2)

## 2022-03-24 LAB — TSH: TSH: 1.099 u[IU]/mL (ref 0.350–4.500)

## 2022-03-24 LAB — I-STAT BETA HCG BLOOD, ED (MC, WL, AP ONLY): I-stat hCG, quantitative: <5 m[IU]/mL (ref <5)

## 2022-03-24 LAB — MAGNESIUM: Magnesium: 2.3 mg/dL (ref 1.7–2.4)

## 2022-03-24 MED ORDER — POTASSIUM CHLORIDE CRYS ER 20 MEQ PO TBCR: 20.0000 meq | EXTENDED_RELEASE_TABLET | Freq: Every day | ORAL | 0 refills | Status: DC

## 2022-03-24 MED ORDER — POTASSIUM CHLORIDE CRYS ER 20 MEQ PO TBCR
40.0000 meq | EXTENDED_RELEASE_TABLET | ORAL | Status: AC
  Administered 2022-03-24: 40 meq via ORAL
  Filled 2022-03-24: qty 2

## 2022-03-24 NOTE — Telephone Encounter (Signed)
Patient states she has been having irregular heart beat since 03/21/22. States her heart is beating too fast.  Transferred to Triage.

## 2022-03-24 NOTE — Discharge Instructions (Addendum)
You were seen for your palpitations in the emergency department.  You are found to have premature ventricular contractions and low potassium.   At home, please take with the potassium we have prescribed you and follow-up with your primary doctor about your thyroid tests which are pending at this time.    Cardiology will call you about an appointment to see if you need a Holter monitor.  Return immediately to the emergency department if you experience any of the following: Fainting, chest pain, or any other concerning symptoms.    Thank you for visiting our Emergency Department. It was a pleasure taking care of you today.

## 2022-03-24 NOTE — ED Triage Notes (Signed)
Patient arrives ambulatory by POV c/o intermittent heart racing since Saturday. Denies any chest pain or shortness of breath. Describes feeling it in her neck and lower abdomen.

## 2022-03-24 NOTE — Telephone Encounter (Signed)
Pt Sent to ED   Patient Name: Monica Washington Gender: Female DOB: 1980-04-03 Age: 42 Y 55 M 21 D Return Phone Number: [phone] (Primary) Address: City/ State/ Zip: [place] [address] Client [facility] [place] Client Site [facility] [place] [facility] Provider Morene Rankins, [place]- [place] Contact Type Call Who Is Calling Patient / Member / Family / Caregiver Call Type Triage / Clinical Relationship To Patient Self Return Phone Number [phone] (Primary) Chief Complaint Heart palpitations or irregular heartbeat Reason for Call Symptomatic / Request for [place] states her heart is beating really fast and has irregular heart beat. Translation No Nurse Assessment Nurse: Ysidro Evert, RN, Levada Dy Date/Time (Eastern Time): 03/24/2022 9:28:05 AM Confirm and document reason for call. If symptomatic, describe symptoms. ---Caller states her heart is beating fast and she is having palpitations Does the patient have any new or worsening symptoms? ---Yes Will a triage be completed? ---Yes Related visit to physician within the last 2 weeks? ---No Does the PT have any chronic conditions? (i.e. diabetes, asthma, this includes High risk factors for pregnancy, etc.) ---No Is the patient pregnant or possibly pregnant? (Ask all females between the ages of 50-55) ---No Is this a behavioral health or substance abuse call? ---No Guidelines Guideline Title Affirmed Question Affirmed Notes Nurse Date/Time (Eastern Time) Heart Rate and Heartbeat Questions [1] Heart beating very rapidly (e.g., > 140 / minute) AND [2] present now (Exception: During exercise.) Ysidro Evert, RN, Levada Dy 03/24/2022 9:28:58 AM PLEASE NOTE: All timestamps contained within this report are represented as [place] Standard Time. CONFIDENTIALTY NOTICE: This fax transmission is intended only for the addressee. It contains information that is legally  privileged, confidential or otherwise protected from use or disclosure. If you are not the intended recipient, you are strictly prohibited from reviewing, disclosing, copying using or disseminating any of this information or taking any action in reliance on or regarding this information. If you have received this fax in error, please notify us immediately by telephone so that we can arrange for its return to [place]. Phone: [phone], Toll-Free: [phone], Fax: [phone] Page: 2 of 2 Call Id: [id number] [place]. Time Eilene Ghazi Time) Disposition Final User 03/24/2022 9:33:09 AM Go to ED Now Yes Ysidro Evert, RN, Levada Dy Final Disposition 03/24/2022 9:33:09 AM Go to ED Now Yes Ysidro Evert, RN, Marin Shutter Disagree/Comply Comply Caller Understands Yes PreDisposition Did not know what to do Care Advice Given Per Guideline GO TO ED NOW: * You need to be seen in the Emergency Department. * Go to the ED at ___________ [place] now. Drive carefully. * Another adult should drive. * Patient should not delay going to the emergency department. ANOTHER ADULT SHOULD DRIVE: * It is better and safer if another adult drives instead of you. CARE ADVICE given per Heart Rate and Heartbeat Questions (Adult) guideline. Referrals [facility] - ED

## 2022-03-24 NOTE — ED Notes (Signed)
Dc instructions and scripts reviewed with pt no questions or concerns at this time.

## 2022-03-24 NOTE — Telephone Encounter (Signed)
FYI, see Triage note. Pt sent to ED.

## 2022-03-24 NOTE — ED Provider Notes (Signed)
[facility] Provider Note   CSN: [id number] Arrival date & time: 03/24/22  [id number]     History  Chief Complaint  Patient presents with   Palpitations    Monica Washington is a 42 y.o. female.  42 year old female who presents to the emergency department with palpitations.  Reports that on Saturday she started feeling intermittent palpitations that would last for approximately 15 minutes at a time and spontaneously resolved.  Feels that in her throat and abdomen.  Denies any presyncope or syncopal events.  No chest pain or shortness of breath.  No leg swelling.  No history of arrhythmias.  No family history of arrhythmias or sudden cardiac death.  Drinks 1 cappuccino a day but no excessive caffeine or alcohol use.  Does vape but denies any other drug use.       Home Medications Prior to Admission medications   Medication Sig Start Date End Date Taking? Authorizing Provider  potassium chloride SA (KLOR-CON M) 20 MEQ tablet Take 1 tablet (20 mEq total) by mouth daily for 5 days. 03/24/22 03/29/22 Yes Fransico Meadow, MD  cyclobenzaprine (FLEXERIL) 10 MG tablet Take 1 tablet (10 mg total) by mouth 3 (three) times daily as needed for muscle spasms. 11/27/21   Loralee Pacas, MD  ketorolac (TORADOL) 10 MG tablet Take 1 tablet (10 mg total) by mouth every 6 (six) hours as needed. 11/27/21   Loralee Pacas, MD  phentermine (ADIPEX-P) 37.5 MG tablet Take 1 tablet (37.5 mg total) by mouth daily before breakfast. 07/10/21   [name], PA  topiramate (TOPAMAX) 25 MG tablet TAKE 1 TABLET(25 MG) BY MOUTH AT BEDTIME 10/15/21   [name], PA      Allergies    Patient has no known allergies.    Review of Systems   Review of Systems  Physical Exam Updated Vital Signs BP 130/88 (BP Location: Right Arm)   Pulse 60   Temp 98.9 F (37.2 C) (Oral)   Resp 18   Ht '5\' 6"'$  (1.676 m)   Wt 101.2 kg   LMP 03/01/2022   SpO2 100%   BMI 35.99  kg/m  Physical Exam Vitals and nursing note reviewed.  Constitutional:      General: She is not in acute distress.    Appearance: She is well-developed.     Comments: During exam had several episodes where she felt uncomfortable and that she was having palpitations which the monitor showed PVCs.  HENT:     Head: Normocephalic and atraumatic.     Right Ear: External ear normal.     Left Ear: External ear normal.     Nose: Nose normal.  Eyes:     Extraocular Movements: Extraocular movements intact.     Conjunctiva/sclera: Conjunctivae normal.     Pupils: Pupils are equal, round, and reactive to light.  Cardiovascular:     Rate and Rhythm: Normal rate. Rhythm irregular.     Heart sounds: No murmur heard. Pulmonary:     Effort: Pulmonary effort is normal. No respiratory distress.     Breath sounds: Normal breath sounds.  Abdominal:     General: Abdomen is flat. There is no distension.     Palpations: Abdomen is soft. There is no mass.     Tenderness: There is no abdominal tenderness. There is no guarding.  Musculoskeletal:     Cervical back: Normal range of motion and neck supple.     Right lower  leg: No edema.     Left lower leg: No edema.  Skin:    General: Skin is warm and dry.  Neurological:     Mental Status: She is alert and oriented to person, place, and time. Mental status is at baseline.  Psychiatric:        Mood and Affect: Mood normal.     ED Results / Procedures / Treatments   Labs (all labs ordered are listed, but only abnormal results are displayed) Labs Reviewed  BASIC METABOLIC PANEL - Abnormal; Notable for the following components:      Result Value   Potassium 3.3 (*)    Glucose, Bld 115 (*)    Calcium 8.5 (*)    All other components within normal limits  CBC  MAGNESIUM  TSH  I-STAT BETA HCG BLOOD, ED ([facility], [facility], [facility] ONLY)    EKG EKG Interpretation  Date/Time:  Tuesday March 24 2022 10:02:47 EST Ventricular Rate:  68 PR Interval:  147 QRS  Duration: 103 QT Interval:  395 QTC Calculation: 421 R Axis:   41 Text Interpretation: Sinus rhythm Low voltage, precordial leads RSR' in V1 or V2, right VCD or RVH Confirmed by Margaretmary Eddy [phone]) on 03/24/2022 10:05:51 AM  Radiology DG Chest 2 View  Result Date: 03/24/2022 CLINICAL DATA:  Palpitations EXAM: CHEST - 2 VIEW COMPARISON:  None Available. FINDINGS: The heart size and mediastinal contours are within normal limits. Both lungs are clear. The visualized skeletal structures are unremarkable. IMPRESSION: No active cardiopulmonary disease. Electronically Signed   By: Marin Roberts M.D.   On: 03/24/2022 11:08    Procedures Procedures   Medications Ordered in ED Medications  potassium chloride SA (KLOR-CON M) CR tablet 40 mEq (40 mEq Oral Given 03/24/22 1142)    ED Course/ Medical Decision Making/ A&P                             Medical Decision Making Amount and/or Complexity of Data Reviewed Labs: ordered. Radiology: ordered.  Risk Prescription drug management.   Monica Washington is a 42 y.o. female with comorbidities that complicate the patient evaluation including vape use who presents with palpitations  Initial Ddx:  Arrhythmia, MI, PE, electrolyte abnormalities, hyperthyroidism  MDM:  Feel the patient may be having a paroxysmal arrhythmia given her symptoms.  Did have events while was in the room and noticed PVCs without sustained runs.  No symptoms of suggest MI or PE at this time.  Will check her electrolytes and thyroid function.  Plan:  Labs Electrolytes TSH EKG Chest x-ray  ED Summary/Re-evaluation:  Patient underwent the above workup which was unremarkable aside from mild hypokalemia.  No additional events on telemetry.  TSH pending and will take some time to come back so instructed patient to follow-up with her primary doctor regarding this.  Patient was given prescription of 20 mill equivalents of potassium per day to take and was instructed to  follow-up with her primary doctor regarding her TSH and for repeat lab check.  Outpatient referral to cardiology placed for evaluation for Holter monitor.  This patient presents to the ED for concern of complaints listed in HPI, this involves an extensive number of treatment options, and is a complaint that carries with it a high risk of complications and morbidity. Disposition including potential need for admission considered.   Dispo: DC Home. Return precautions discussed including, but not limited to, those listed in the AVS.  Allowed pt time to ask questions which were answered fully prior to dc.  Records reviewed Outpatient Clinic Notes The following labs were independently interpreted: Chemistry and show  hypokalemia I independently reviewed the following imaging with scope of interpretation limited to determining acute life threatening conditions related to emergency care: Chest x-ray and agree with the radiologist interpretation with the following exceptions: None I personally reviewed and interpreted cardiac monitoring:  Sinus rhythm with occasional PVCs I personally reviewed and interpreted the pt's EKG: see above for interpretation  I have reviewed the patients home medications and made adjustments as needed  Final Clinical Impression(s) / ED Diagnoses Final diagnoses:  PVC (premature ventricular contraction)  Hypokalemia    Rx / DC Orders ED Discharge Orders          Ordered    potassium chloride SA (KLOR-CON M) 20 MEQ tablet  Daily        03/24/22 1141    Ambulatory referral to Cardiology       Comments: Palpitations evaluate for Holter monitor   03/24/22 1142              Fransico Meadow, MD 03/24/22 1255

## 2022-03-25 ENCOUNTER — Telehealth: Payer: Self-pay

## 2022-03-25 NOTE — Transitions of Care (Post Inpatient/ED Visit) (Unsigned)
   03/25/2022  Name: KHYLEIGH MUGRAGE MRN: [id number] DOB: 12/20/1980  Today's TOC FU Call Status:    Transition Care Management Follow-up Telephone Call Date of Discharge: 03/24/22 Discharge Facility: [facility]) Type of Discharge: Emergency Department Reason for ED Visit: Cardiac Conditions How have you been since you were released from the hospital?: Same Any questions or concerns?: No  Items Reviewed: Did you receive and understand the discharge instructions provided?: Yes Medications obtained and verified?: Yes (Medications Reviewed) Any new allergies since your discharge?: Yes Dietary orders reviewed?: NA Do you have support at home?: Yes  Home Care and Equipment/Supplies: [place] Ordered?: NA Any new equipment or medical supplies ordered?: NA  Functional Questionnaire: Do you need assistance with bathing/showering or dressing?: No Do you need assistance with meal preparation?: No Do you need assistance with eating?: No Do you have difficulty maintaining continence: No Do you need assistance with getting out of bed/getting out of a chair/moving?: No Do you have difficulty managing or taking your medications?: No  Folllow up appointments reviewed: PCP Follow-up appointment confirmed?: Yes Date of PCP follow-up appointment?: 03/26/22 Follow-up Provider: [name], [facility] Follow-up appointment confirmed?: NA Do you need transportation to your follow-up appointment?: No Do you understand care options if your condition(s) worsen?: Yes-patient verbalized understanding    [place], [place]

## 2022-03-26 ENCOUNTER — Ambulatory Visit (INDEPENDENT_AMBULATORY_CARE_PROVIDER_SITE_OTHER): Payer: 59 | Admitting: Physician Assistant

## 2022-03-26 ENCOUNTER — Encounter: Payer: Self-pay | Admitting: Physician Assistant

## 2022-03-26 VITALS — BP 110/70 | HR 89 | Temp 97.5°F | Ht 66.0 in | Wt 214.4 lb

## 2022-03-26 DIAGNOSIS — R002 Palpitations: Secondary | ICD-10-CM

## 2022-03-26 DIAGNOSIS — E876 Hypokalemia: Secondary | ICD-10-CM

## 2022-03-26 DIAGNOSIS — R7309 Other abnormal glucose: Secondary | ICD-10-CM

## 2022-03-26 LAB — COMPREHENSIVE METABOLIC PANEL
ALT: 14 U/L (ref 0–35)
AST: 15 U/L (ref 0–37)
Albumin: 3.8 g/dL (ref 3.5–5.2)
Alkaline Phosphatase: 47 U/L (ref 39–117)
BUN: 9 mg/dL (ref 6–23)
CO2: 29 mEq/L (ref 19–32)
Calcium: 9.3 mg/dL (ref 8.4–10.5)
Chloride: 102 mEq/L (ref 96–112)
Creatinine, Ser: 0.91 mg/dL (ref 0.40–1.20)
GFR: 77.19 mL/min (ref >60.00)
Glucose, Bld: 91 mg/dL (ref 70–99)
Potassium: 4.4 mEq/L (ref 3.5–5.1)
Sodium: 137 mEq/L (ref 135–145)
Total Bilirubin: 0.6 mg/dL (ref 0.2–1.2)
Total Protein: 7.1 g/dL (ref 6.0–8.3)

## 2022-03-26 LAB — HEMOGLOBIN A1C: Hgb A1c MFr Bld: 6 % (ref 4.6–6.5)

## 2022-03-26 NOTE — Progress Notes (Signed)
Monica Washington is a 42 y.o. female here for a new problem.  History of Present Illness:   Chief Complaint  Patient presents with   Follow-up    Pt is here for ED f/u from 03/24/2022.    HPI  Palpitations:  She began experiencing palpitations on 03/20/2022 at night, while lying down.  She denies any chest pain or shortness of breath.  She describe it as feeling like she had to burp, so she drank a soda. She had to sleep on her side that night and was unable to sleep well.  She believes palpitations may be attributed to anxiety and stress.  She continued having persistent palpitations which prompted her to visit the ED on 03/24/2022.  She reports her EKG showed PVCs and that her potassium was low.  She states she continues to experience palpitations, and nothing has resolved her symptoms yet.  She is scheduled to meet with cardiology next month. She regularly drinks a cappuccino every morning.  She is compliant with her potassium pill and took it this morning.  She notes she has been vaping for about 4 years.   Past Medical History:  Diagnosis Date   Acute back pain with radiculopathy 11/27/2021     Social History   Tobacco Use   Smoking status: Former    Types: Cigarettes    Quit date: 01/19/2013    Years since quitting: 9.1   Smokeless tobacco: Never  Vaping Use   Vaping Use: Some days  Substance Use Topics   Alcohol use: Not Currently    Comment: cannot tolerate   Drug use: Never    History reviewed. No pertinent surgical history.  Family History  Problem Relation Age of Onset   Healthy Mother    Diabetes Father    Breast cancer Maternal Aunt        68s   Colon cancer Neg Hx     No Known Allergies  Current Medications:   Current Outpatient Medications:    cyclobenzaprine (FLEXERIL) 10 MG tablet, Take 1 tablet (10 mg total) by mouth 3 (three) times daily as needed for muscle spasms., Disp: 30 tablet, Rfl: 0   ketorolac (TORADOL) 10 MG tablet, Take 1 tablet (10  mg total) by mouth every 6 (six) hours as needed., Disp: 20 tablet, Rfl: 0   potassium chloride SA (KLOR-CON M) 20 MEQ tablet, Take 1 tablet (20 mEq total) by mouth daily for 5 days., Disp: 5 tablet, Rfl: 0   Review of Systems:   Review of Systems  Cardiovascular:  Positive for palpitations.    Vitals:   Vitals:   03/26/22 0857  BP: 110/70  Pulse: 89  Temp: (!) 97.5 F (36.4 C)  TempSrc: Temporal  SpO2: 98%  Weight: 214 lb 6.1 oz (97.2 kg)  Height: '5\' 6"'$  (1.676 m)     Body mass index is 34.6 kg/m.  Physical Exam:   Physical Exam Vitals and nursing note reviewed.  Constitutional:      General: She is not in acute distress.    Appearance: She is well-developed. She is not ill-appearing or toxic-appearing.  Cardiovascular:     Rate and Rhythm: Normal rate and regular rhythm.     Pulses: Normal pulses.     Heart sounds: Normal heart sounds, S1 normal and S2 normal.  Pulmonary:     Effort: Pulmonary effort is normal.     Breath sounds: Normal breath sounds.  Skin:    General: Skin is warm and dry.  Neurological:     Mental Status: She is alert.     GCS: GCS eye subscore is 4. GCS verbal subscore is 5. GCS motor subscore is 6.  Psychiatric:        Speech: Speech normal.        Behavior: Behavior normal. Behavior is cooperative.     Assessment and Plan:   Palpitations Ongoing No red flags Reviewed ER work-up Recommend follow-up with cardiology as scheduled Avoid additional caffeine If worsening sx, needs to go to the ER Will advise on potassium supplementation after re-check of labs today  Hypokalemia Update today Will advise on potassium supplementation after re-check of labs today  Elevated glucose Update A1c to assess  I,Rachel Rivera,acting as a scribe for [employer], PA.,have documented all relevant documentation on the behalf of [name], PA,as directed by  [name], PA while in the presence of [name], [place].  I, [name], [place], have reviewed all documentation for this visit. The documentation on 03/26/22 for the exam, diagnosis, procedures, and orders are all accurate and complete.  [name], PA-C

## 2022-03-26 NOTE — Patient Instructions (Signed)
It was great to see you!  We are going to update your blood work today to make sure your potassium and calcium are normal. Continue your potassium supplement as prescribed until I tell you otherwise.  Please follow-up for your cardiology appointment as scheduled.  Take care,  [name] PA-C

## 2022-03-27 ENCOUNTER — Encounter: Payer: Self-pay | Admitting: Physician Assistant

## 2022-03-27 DIAGNOSIS — E88819 Insulin resistance, unspecified: Secondary | ICD-10-CM | POA: Insufficient documentation

## 2022-04-14 NOTE — Progress Notes (Signed)
CARDIOLOGY CONSULT NOTE       Patient ID: Monica Washington MRN: [id number] DOB/AGE: 44-Apr-1982 44 y.o.  Admit date: (Not on file) Referring Physician: Bufford ButtnerWorley Primary Physician: Jarold MottoWorley, Samantha, PA Primary Cardiologist: New Reason for Consultation: PVC;s   HPI:  42 y.o. referred by PA Bufford ButtnerWorley for PVC;s No history of vascular/cardiac dx. Seen in ED 03/24/22 3/2 noted palpitations 15 minutes at a time intermittent Spontaneous onset/resolution  Feels a lump in her throat and abdomen. No long runs and no presyncope, chest pain or dyspnea No high risk family history No excess ETOH/caffeine One cappuccino / day She vapes for last 4 years  CXR NAD no other drug use In ER symptoms coincide with PVC;s  K was 3.3 supplemented  F/U 3/7 with primary 4.4  TSH and Mg normal Not pregnant  Hct 38.7 LDL 96 A1c 6  Increased frequency laying down at night ? 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 She has 44 yo girl at home and 44 yo lives near by Symptoms better since ER and K supplement   ROS All other systems reviewed and negative except as noted above  Past Medical History:  Diagnosis Date   Acute back pain with radiculopathy 11/27/2021    Family History  Problem Relation Age of Onset   Healthy Mother    Diabetes Father    Breast cancer Maternal Aunt        440s   Colon cancer Neg Hx     Social History   Socioeconomic History   Marital status: Single    Spouse name: Not on file   Number of children: Not on file   Years of education: Not on file   Highest education level: Not on file  Occupational History   Not on file  Tobacco Use   Smoking status: Former    Types: Cigarettes    Quit date: 01/19/2013    Years since quitting: 9.2   Smokeless tobacco: Never  Vaping Use   Vaping Use: Some days  Substance and Sexual Activity   Alcohol use: Not Currently    Comment: cannot  tolerate   Drug use: Never   Sexual activity: Not Currently    Birth control/protection: None  Other Topics Concern   Not on file  Social History Narrative   Has 44 yo and 44 yo   Works for [employer], [employer] farm managerpharm tech   [place]   Social Determinants of Corporate investment bankerHealth   Financial Resource Strain: Not on [employer]file  Food Insecurity: Not on file  Transportation Needs: Not on file  Physical Activity: Not on file  Stress: Not on file  Social Connections: Not on file  Intimate Partner Violence: Not on file    No past surgical history on file.    Current Outpatient Medications:    cyclobenzaprine (FLEXERIL) 10 MG tablet, Take 1 tablet (10 mg total) by mouth 3 (three) times daily as needed for muscle spasms., Disp: 30 tablet, Rfl: 0   ketorolac (TORADOL) 10 MG tablet, Take 1 tablet (10 mg total) by mouth every 6 (six) hours as needed., Disp: 20 tablet, Rfl: 0   potassium chloride SA (KLOR-CON M) 20 MEQ tablet, Take 1 tablet (20 mEq total) by mouth daily for 5 days., Disp: 5 tablet, Rfl: 0    Physical Exam: Last menstrual period 03/09/2022.  Affect appropriate Overweight female  HEENT: normal Neck supple with no adenopathy JVP normal no bruits no thyromegaly Lungs clear with no wheezing and good diaphragmatic motion Heart:  S1/S2 no murmur, no rub, gallop or click PMI normal Abdomen: benighn, BS positve, no tenderness, no AAA no bruit.  No HSM or HJR Distal pulses intact with no bruits No edema Neuro non-focal Skin warm and dry No muscular weakness   Labs:   Lab Results  Component Value Date   WBC 7.9 03/24/2022   HGB 12.3 03/24/2022   HCT 38.7 03/24/2022   MCV 86.8 03/24/2022   PLT 281 03/24/2022   No results for input(s): "NA", "K", "CL", "CO2", "BUN", "CREATININE", "CALCIUM", "PROT", "BILITOT", "ALKPHOS", "ALT", "AST", "GLUCOSE" in the last 168 hours.  Invalid input(s): "LABALBU" No results found for: "CKTOTAL", "CKMB", "CKMBINDEX", "TROPONINI"  Lab Results   Component Value Date   CHOL 161 07/10/2021   CHOL 168 01/16/2020   Lab Results  Component Value Date   HDL 55.70 07/10/2021   HDL 53.50 01/16/2020   Lab Results  Component Value Date   LDLCALC 96 07/10/2021   LDLCALC 104 (H) 01/16/2020   Lab Results  Component Value Date   TRIG 49.0 07/10/2021   TRIG 55.0 01/16/2020   Lab Results  Component Value Date   CHOLHDL 3 07/10/2021   CHOLHDL 3 01/16/2020   No results found for: "LDLDIRECT"    Radiology: DG Chest 2 View  Result Date: 03/24/2022 CLINICAL DATA:  Palpitations EXAM: CHEST - 2 VIEW COMPARISON:  None Available. FINDINGS: The heart size and mediastinal contours are within normal limits. Both lungs are clear. The visualized skeletal structures are unremarkable. IMPRESSION: No active cardiopulmonary disease. Electronically Signed   By: Lorenza CambridgeHemant  Desai M.D.   On: 03/24/2022 11:08    EKG: see HPI   ASSESSMENT AND PLAN:   Palpitations: benign sounding TTE to assess EF and structural heart dx. Since her symptoms are better does not need monitor or additional testing Discussed limiting stimulants Thyroid ok per primary If her symptoms were to recur can consider Zio monitor   TTE  F/U PRN pending testing results  Signed: Charlton Hawseter Cortez Flippen 04/14/2022, 1:19 PM

## 2022-04-20 DIAGNOSIS — Z419 Encounter for procedure for purposes other than remedying health state, unspecified: Secondary | ICD-10-CM | POA: Diagnosis not present

## 2022-04-24 ENCOUNTER — Encounter: Payer: Self-pay | Admitting: Cardiovascular Disease

## 2022-04-24 ENCOUNTER — Ambulatory Visit: Payer: 59 | Attending: Cardiovascular Disease | Admitting: Cardiovascular Disease

## 2022-04-24 VITALS — BP 112/68 | HR 75 | Ht 66.0 in | Wt 214.0 lb

## 2022-04-24 DIAGNOSIS — R002 Palpitations: Secondary | ICD-10-CM

## 2022-04-24 NOTE — Patient Instructions (Signed)
Medication Instructions:  Your physician recommends that you continue on your current medications as directed. Please refer to the Current Medication list given to you today.  *If you need a refill on your cardiac medications before your next appointment, please call your pharmacy*   Lab Work: NONE If you have labs (blood work) drawn today and your tests are completely normal, you will receive your results only by: MyChart Message (if you have MyChart) OR A paper copy in the mail If you have any lab test that is abnormal or we need to change your treatment, we will call you to review the results.   Testing/Procedures: ECHO Your physician has requested that you have an echocardiogram. Echocardiography is a painless test that uses sound waves to create images of your heart. It provides your doctor with information about the size and shape of your heart and how well your heart's chambers and valves are working. This procedure takes approximately one hour. There are no restrictions for this procedure. Please do NOT wear cologne, perfume, aftershave, or lotions (deodorant is allowed). Please arrive 15 minutes prior to your appointment time.  Follow-Up: At [facility], you and your health needs are our priority.  As part of our continuing mission to provide you with exceptional heart care, we have created designated Provider Care Teams.  These Care Teams include your primary Cardiologist (physician) and Advanced Practice Providers (APPs -  Physician Assistants and Nurse Practitioners) who all work together to provide you with the care you need, when you need it.  Your next appointment:   As Needed  Provider:   Charlton Haws, MD

## 2022-05-06 ENCOUNTER — Other Ambulatory Visit (HOSPITAL_COMMUNITY): Payer: 59

## 2022-05-20 DIAGNOSIS — Z419 Encounter for procedure for purposes other than remedying health state, unspecified: Secondary | ICD-10-CM | POA: Diagnosis not present

## 2022-05-21 ENCOUNTER — Ambulatory Visit (HOSPITAL_COMMUNITY): Payer: 59

## 2022-05-21 ENCOUNTER — Ambulatory Visit (INDEPENDENT_AMBULATORY_CARE_PROVIDER_SITE_OTHER): Payer: 59 | Admitting: Family Medicine

## 2022-05-21 ENCOUNTER — Encounter: Payer: Self-pay | Admitting: Family Medicine

## 2022-05-21 VITALS — BP 100/58 | HR 94 | Temp 98.1°F | Ht 66.0 in | Wt 215.8 lb

## 2022-05-21 DIAGNOSIS — G44209 Tension-type headache, unspecified, not intractable: Secondary | ICD-10-CM | POA: Diagnosis not present

## 2022-05-21 DIAGNOSIS — M545 Low back pain, unspecified: Secondary | ICD-10-CM | POA: Diagnosis not present

## 2022-05-21 NOTE — Progress Notes (Signed)
Subjective  CC:  Chief Complaint  Patient presents with   Headache    Pt has been having Headaches and seeing spots which started this week. Feels like today is a little worse than it has been   Same day acute visit; PCP not available. New pt to me. Chart reviewed.   HPI: Monica Washington is a 42 y.o. female who presents to the office today to address the problems listed above in the chief complaint. 42 year old healthy female with history of palpitations, lumbar radiculopathy presents due to 3 to 4 days of frontal headache.  Headaches are atypical for her.  She describes a frontal throbbing type headache; it is very mild this morning.  However she feels groggy.  A few days ago she had a more significant headache that was relieved with Tylenol.  She has had some neck pain and low back pain.  Last night she took tramadol and cyclobenzaprine.  That helped both the headache and the back pain.  She denies unilateral headache, sharp stabbing headache, diplopia although she does have a few scotomata intermittently, dysarthria, hemiparesis.  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  She reports she has been waking up every hour thinking about her responsibilities.  No history of mood problems.  No panic attacks.  Request to stay home from work today.  Assessment  1. Tension headache   2. Low back pain, episodic      Plan  Likely tension headache: No red flag symptoms identified.  No migrainous features.  Education given.  To use Tylenol and/or Advil for treatment.  Recommend rest, relaxation and stress reduction.  Discussed red flag symptoms.  Follow-up if not improving. Low back pain has improved.  Discussed appropriate lifting  Follow up: As needed Visit date not found  No orders of the defined types were placed in this encounter.  No orders of the defined types were placed in  this encounter.     I reviewed the patients updated PMH, FH, and SocHx.    Patient Active Problem List   Diagnosis Date Noted   Insulin resistance 03/27/2022   Acute back pain with radiculopathy 11/27/2021   Current Meds  Medication Sig   cyclobenzaprine (FLEXERIL) 10 MG tablet Take 1 tablet (10 mg total) by mouth 3 (three) times daily as needed for muscle spasms.   ketorolac (TORADOL) 10 MG tablet Take 1 tablet (10 mg total) by mouth every 6 (six) hours as needed.    Allergies: Patient has No Known Allergies. Family History: Patient family history includes Breast cancer in her maternal aunt; Diabetes in her father; Healthy in her mother. Social History:  Patient  reports that she quit smoking about 9 years ago. Her smoking use included cigarettes. She has never used smokeless tobacco. She reports that she does not currently use alcohol. She reports that she does not use drugs.  Review of Systems: Constitutional: Negative for fever malaise or anorexia Cardiovascular: negative for chest pain Respiratory: negative for SOB or persistent cough Gastrointestinal: negative for abdominal pain  Objective  Vitals: BP (!) 100/58   Pulse 94   Temp 98.1 F (36.7 C)   Ht 5\' 6"  (1.676 m)   Wt 215 lb 12.8 oz (97.9 kg)   SpO2 97%   BMI 34.83 kg/m  General: no acute distress , appears groggy, A&Ox3 HEENT: PEERL, EOMI, conjunctiva normal,  neck is supple Neuro: Cranial nerves II through XII intact, normal gait  Commons side effects, risks, benefits, and alternatives for medications and treatment plan prescribed today were discussed, and the patient expressed understanding of the given instructions. Patient is instructed to call or message via MyChart if he/she has any questions or concerns regarding our treatment plan. No barriers to understanding were identified. We discussed Red Flag symptoms and signs in detail. Patient expressed understanding regarding what to do in case of urgent or  emergency type symptoms.  Medication list was reconciled, printed and provided to the patient in AVS. Patient instructions and summary information was reviewed with the patient as documented in the AVS. This note was prepared with assistance of Dragon voice recognition software. Occasional wrong-word or sound-a-like substitutions may have occurred due to the inherent limitations of voice recognition software

## 2022-05-21 NOTE — Patient Instructions (Signed)
Please follow up if symptoms do not improve or as needed.    Use Tylenol, Advil or Excedrin Migraine to treat your symptoms.  Getting a good night rest will be important.  Tension Headache, Adult A tension headache is a feeling of pain, pressure, or aching over the front and sides of the head. The pain can be dull, or it can feel tight. There are two types of tension headache: Episodic tension headache. This is when the headaches happen fewer than 15 days a month. Chronic tension headache. This is when the headaches happen more than 15 days a month during a 79-month period. A tension headache can last from 30 minutes to several days. It is the most common kind of headache. Tension headaches are not normally associated with nausea or vomiting, and they do not get worse with physical activity. What are the causes? The exact cause of this condition is not known. Tension headaches are often triggered by stress, anxiety, or depression. Other triggers may include: Alcohol. Too much caffeine or caffeine withdrawal. Respiratory infections, such as colds, flu, or sinus infections. Dental problems or teeth clenching. Fatigue. Holding your head and neck in the same position for a long period of time, such as while using a computer. Smoking. Arthritis of the neck. What are the signs or symptoms? Symptoms of this condition include: A feeling of pressure or tightness around the head. Dull, aching head pain. Pain over the front and sides of the head. Tenderness in the muscles of the head, neck, and shoulders. How is this diagnosed? This condition may be diagnosed based on your symptoms, your medical history, and a physical exam. If your symptoms are severe or unusual, you may have imaging tests, such as a CT scan or an MRI of your head. Your vision may also be checked. How is this treated? This condition may be treated with lifestyle changes and with medicines that help relieve symptoms. Follow these  instructions at home: Managing pain Take over-the-counter and prescription medicines only as told by your health care provider. When you have a headache, lie down in a dark, quiet room. If directed, put ice on your head and neck. To do this: Put ice in a plastic bag. Place a towel between your skin and the bag. Leave the ice on for 20 minutes, 2-3 times a day. Remove the ice if your skin turns bright red. This is very important. If you cannot feel pain, heat, or cold, you have a greater risk of damage to the area. If directed, apply heat to the back of your neck as often as told by your health care provider. Use the heat source that your health care provider recommends, such as a moist heat pack or a heating pad. Place a towel between your skin and the heat source. Leave the heat on for 20-30 minutes. Remove the heat if your skin turns bright red. This is especially important if you are unable to feel pain, heat, or cold. You have a greater risk of getting burned. Eating and drinking Eat meals on a regular schedule. If you drink alcohol: Limit how much you have to: 0-1 drink a day for women who are not pregnant. 0-2 drinks a day for men. Know how much alcohol is in your drink. In the [place] one drink equals one 12 oz bottle of beer (355 mL), one 5 oz glass of wine (148 mL), or one 1 oz glass of hard liquor (44 mL). Drink enough fluid to keep  your urine pale yellow. Decrease your caffeine intake, or stop using caffeine. Lifestyle Get 7-9 hours of sleep each night, or get the amount of sleep recommended by your health care provider. At bedtime, remove computers, phones, and tablets from your room. Find ways to manage your stress. This may include: Exercise. Deep breathing exercises. Yoga. Listening to music. Positive mental imagery. Try to sit up straight and avoid tensing your muscles. Do not use any products that contain nicotine or tobacco. These include cigarettes, chewing tobacco,  and vaping devices, such as e-cigarettes. If you need help quitting, ask your health care provider. General instructions  Avoid any headache triggers. Keep a journal to help find out what may trigger your headaches. For example, write down: What you eat and drink. How much sleep you get. Any change to your diet or medicines. Keep all follow-up visits. This is important. Contact a health care provider if: Your headache does not get better. Your headache comes back. You are sensitive to sounds, light, or smells because of a headache. You have nausea or you vomit. Your stomach hurts. Get help right away if: You suddenly develop a severe headache, along with any of the following: A stiff neck. Nausea and vomiting. Confusion. Weakness in one part or one side of your body. Double vision or loss of vision. Shortness of breath. Rash. Unusual sleepiness. Fever or chills. Trouble speaking. Pain in your eye or ear. Trouble walking or balancing. Feeling faint or passing out. Summary A tension headache is a feeling of pain, pressure, or aching over the front and sides of the head. A tension headache can last from 30 minutes to several days. It is the most common kind of headache. This condition may be diagnosed based on your symptoms, your medical history, and a physical exam. This condition may be treated with lifestyle changes and with medicines that help relieve symptoms. This information is not intended to replace advice given to you by your health care provider. Make sure you discuss any questions you have with your health care provider. Document Revised: 10/05/2019 Document Reviewed: 10/05/2019 Elsevier Patient Education  2023 [employer].

## 2022-06-17 ENCOUNTER — Ambulatory Visit (HOSPITAL_COMMUNITY): Payer: 59

## 2022-06-20 DIAGNOSIS — Z419 Encounter for procedure for purposes other than remedying health state, unspecified: Secondary | ICD-10-CM | POA: Diagnosis not present

## 2022-07-20 DIAGNOSIS — Z419 Encounter for procedure for purposes other than remedying health state, unspecified: Secondary | ICD-10-CM | POA: Diagnosis not present

## 2022-08-04 ENCOUNTER — Ambulatory Visit: Payer: 59 | Admitting: Physician Assistant

## 2022-08-04 VITALS — BP 124/80 | HR 69 | Temp 97.3°F | Ht 66.0 in | Wt 221.4 lb

## 2022-08-04 DIAGNOSIS — L0291 Cutaneous abscess, unspecified: Secondary | ICD-10-CM

## 2022-08-04 DIAGNOSIS — E88819 Insulin resistance, unspecified: Secondary | ICD-10-CM | POA: Diagnosis not present

## 2022-08-04 DIAGNOSIS — E785 Hyperlipidemia, unspecified: Secondary | ICD-10-CM | POA: Diagnosis not present

## 2022-08-04 DIAGNOSIS — R519 Headache, unspecified: Secondary | ICD-10-CM

## 2022-08-04 LAB — HEMOGLOBIN A1C: Hgb A1c MFr Bld: 6.2 % (ref 4.6–6.5)

## 2022-08-04 NOTE — Progress Notes (Signed)
Monica Washington is a 42 y.o. female here for a new problem.  History of Present Illness:   Chief Complaint  Patient presents with   Headache    Pt c/o headache on Friday and Saturday.    Shoulder Pain    Pt c/o left should pain off and on x 1 week.   Recurrent Skin Infections    Pt c/o boil left groin x 1 month, has decreased in size.   Headache:  She reports a pressure headache last Friday and Saturday.  She first developed it when driving to work.  She also developed left arm pain during her headache.  She consulted her pharmacist at work and was told to take 800 mg ibuprofen.  Her headache resolved shortly after taking ibuprofen.  She also developed headache and left arm pain the next day after eating breakfast.  Her headaches lasted much longer compared to the first and she did not take ibuprofen during this episode.  She ate bacon and other salty foods prior to her headaches but notes she typically eats salty like foods a couple times a week and had no issues prior to this.  She denies vision changes during her headaches or changes to her sleep.   She's had no episodes of headache since Saturday.  She does not measure her blood pressure regularly.  She continues following up with her cardiologist regularly.  She has gained 6 lbs since May 2024 and denies any chance of pregnancy.  BP Readings from Last 3 Encounters:  08/04/22 124/80  05/21/22 (!) 100/58  04/24/22 112/68   Pulse Readings from Last 3 Encounters:  08/04/22 69  05/21/22 94  04/24/22 75   She is most concerned that she has diabetes She has family history of diabetes mellitus and has history of insulin resistance  Boil on left groin:  She complains of boil developed on her left groin last month.  It has decreased in size since she first discovered it.  Boil in vaginal area, left abdominal wall, right buttock in the past, all have required I&D. Denies fevers, chills, nausea and vomiting   Past Medical  History:  Diagnosis Date   Acute back pain with radiculopathy 11/27/2021   Back pain    Palpitations    PVC (premature ventricular contraction)      Social History   Tobacco Use   Smoking status: Former    Current packs/day: 0.00    Types: Cigarettes    Quit date: 01/19/2013    Years since quitting: 9.5   Smokeless tobacco: Never  Vaping Use   Vaping status: Some Days  Substance Use Topics   Alcohol use: Not Currently    Comment: cannot tolerate   Drug use: Never    No past surgical history on file.  Family History  Problem Relation Age of Onset   Healthy Mother    Diabetes Father    Breast cancer Maternal Aunt        20s   Colon cancer Neg Hx     No Known Allergies  Current Medications:   Current Outpatient Medications:    ibuprofen (ADVIL) 200 MG tablet, Take 800 mg by mouth every 6 (six) hours as needed., Disp: , Rfl:    ketorolac (TORADOL) 10 MG tablet, Take 1 tablet (10 mg total) by mouth every 6 (six) hours as needed. (Patient not taking: Reported on 08/04/2022), Disp: 20 tablet, Rfl: 0   Review of Systems:   Review of Systems  Eyes:        (-)  vision changes during headaches  Skin:        (+)boil on left groin, decreasing in size  Neurological:  Positive for headaches.    Vitals:   Vitals:   08/04/22 1416  BP: 124/80  Pulse: 69  Temp: (!) 97.3 F (36.3 C)  TempSrc: Temporal  SpO2: 98%  Weight: 221 lb 6.1 oz (100.4 kg)  Height: 5\' 6"  (1.676 m)     Body mass index is 35.73 kg/m.  Physical Exam:   Physical Exam Vitals and nursing note reviewed.  Constitutional:      General: She is not in acute distress.    Appearance: She is well-developed. She is not ill-appearing or toxic-appearing.  Cardiovascular:     Rate and Rhythm: Normal rate and regular rhythm.     Pulses: Normal pulses.     Heart sounds: Normal heart sounds, S1 normal and S2 normal.  Pulmonary:     Effort: Pulmonary effort is normal.     Breath sounds: Normal breath sounds.   Skin:    General: Skin is warm and dry.     Comments: Approximately 2 cm raised fluctuant lesion to pubic bone region  Neurological:     General: No focal deficit present.     Mental Status: She is alert.     GCS: GCS eye subscore is 4. GCS verbal subscore is 5. GCS motor subscore is 6.     Cranial Nerves: Cranial nerves 2-12 are intact.     Sensory: Sensation is intact.     Motor: Motor function is intact.     Coordination: Coordination is intact.     Gait: Gait is intact.  Psychiatric:        Speech: Speech normal.        Behavior: Behavior normal. Behavior is cooperative.    Procedure: I&D  Consent: Risks and benefits of therapy discussed with patient who voices understanding and agrees with planned care. No barriers to communication or understanding identified.  After obtaining informed consent, the patient's identity, procedure, and site were verified during a pause prior to proceeding with the minor surgical procedure as per universal protocol recommendations. Pt aware of risks not limited to but including infection, bleeding, damage to near by organs.  Meds, vitals, and allergies reviewed.  Indication: suspected abscess Pt complaints of: erythema, pain, swelling Location: pubic bone Size: 2 cm  Prep: etoh/betadine Anesthesia: 1%lidocaine with epi Incision made with #11 blade Wound explored and loculations removed  Tolerated well without significant blood loss or obvious complications Cautery with silver nitrate performed Sterilized bandage applied to affected area     Assessment and Plan:   Insulin resistance Update A1c and provide recommendations  Hyperlipidemia, unspecified hyperlipidemia type Update lipid panel and provide recommendations  Headache(s) Resolved Recommend monitoring blood pressure if has another episode If red flag symptom(s) develop, recommend emergency room evaluation Neuro exam is wnl  Abscess Tolerated procedure well with resolution  of symptom(s) Aftercare discussed Do not feel like she needs antibiotic(s) at this time -- follow-up if any concerns or new symptom(s)    I,Shehryar Baig,acting as a scribe for [employer], PA.,have documented all relevant documentation on the behalf of Jarold Motto, PA,as directed by  Jarold Motto, PA while in the presence of Jarold Motto, [place].  I, Jarold Motto, [place], have reviewed all documentation for this visit. The documentation on 08/04/22 for the exam, diagnosis, procedures, and orders are all accurate and complete.   Jarold Motto, PA-C

## 2022-08-04 NOTE — Patient Instructions (Signed)
It was great to see you!  Keep area covered while working Otherwise do sitz bath to help further drain  If worsening pain, fever, chills call us as soon as possible and we will start oral antibiotic(s) Consider topical bacitracin to area  If headache(s) return, please check blood pressure  Avoid high salt foods!  Take care,  Jarold Motto PA-C

## 2022-08-05 ENCOUNTER — Encounter: Payer: Self-pay | Admitting: Physician Assistant

## 2022-08-05 ENCOUNTER — Other Ambulatory Visit: Payer: Self-pay | Admitting: Physician Assistant

## 2022-08-05 LAB — COMPREHENSIVE METABOLIC PANEL
ALT: 15 U/L (ref 0–35)
AST: 19 U/L (ref 0–37)
Albumin: 4 g/dL (ref 3.5–5.2)
Alkaline Phosphatase: 47 U/L (ref 39–117)
BUN: 12 mg/dL (ref 6–23)
CO2: 27 mEq/L (ref 19–32)
Calcium: 9.2 mg/dL (ref 8.4–10.5)
Chloride: 100 mEq/L (ref 96–112)
Creatinine, Ser: 0.92 mg/dL (ref 0.40–1.20)
GFR: 75.99 mL/min (ref >60.00)
Glucose, Bld: 92 mg/dL (ref 70–99)
Potassium: 3.9 mEq/L (ref 3.5–5.1)
Sodium: 136 mEq/L (ref 135–145)
Total Bilirubin: 0.4 mg/dL (ref 0.2–1.2)
Total Protein: 7.7 g/dL (ref 6.0–8.3)

## 2022-08-05 LAB — LIPID PANEL
Cholesterol: 185 mg/dL (ref 0–200)
HDL: 58.2 mg/dL (ref >39.00)
LDL Cholesterol: 111 mg/dL — ABNORMAL HIGH (ref 0–99)
NonHDL: 126.96
Total CHOL/HDL Ratio: 3
Triglycerides: 81 mg/dL (ref 0.0–149.0)
VLDL: 16.2 mg/dL (ref 0.0–40.0)

## 2022-08-05 MED ORDER — METFORMIN HCL 500 MG PO TABS: 500.0000 mg | ORAL_TABLET | Freq: Every day | ORAL | 3 refills | Status: DC

## 2022-08-17 MED ORDER — DOXYCYCLINE HYCLATE 100 MG PO TABS: 100.0000 mg | ORAL_TABLET | Freq: Two times a day (BID) | ORAL | 0 refills | Status: DC

## 2022-08-17 NOTE — Telephone Encounter (Signed)
Please see message, does pt need an appt?

## 2022-08-17 NOTE — Telephone Encounter (Signed)
Please see message regarding Metformin and advise.

## 2022-08-17 NOTE — Telephone Encounter (Signed)
Pt said since she started the Metformin she has been having diarrhea and bloating. Asked her if taking with food? Pt said yes, diarrhea has stopped, but now she is constipated, also having burping that smells like eggs. Asked pt if she took Metformin this morning? Pt said no. Told her I will send message to [facility] and get back to you. Pt verbalized understanding.

## 2022-08-17 NOTE — Telephone Encounter (Signed)
Spoke to pt told her per Lelon Mast, I probably need to take a look at it -- the other option is we send in a round of doxycycline for her (100 mg twice daily x 10 days) and if no changes, needs to be seen in office.  Pt verbalized understanding and said she will try the antibiotic first. Told her okay, if symptoms do not improve please schedule an appt. Pt verbalized understanding. I will send Rx now.

## 2022-08-20 DIAGNOSIS — Z419 Encounter for procedure for purposes other than remedying health state, unspecified: Secondary | ICD-10-CM | POA: Diagnosis not present

## 2022-09-20 DIAGNOSIS — Z419 Encounter for procedure for purposes other than remedying health state, unspecified: Secondary | ICD-10-CM | POA: Diagnosis not present

## 2022-10-20 DIAGNOSIS — Z419 Encounter for procedure for purposes other than remedying health state, unspecified: Secondary | ICD-10-CM | POA: Diagnosis not present

## 2022-11-20 DIAGNOSIS — Z419 Encounter for procedure for purposes other than remedying health state, unspecified: Secondary | ICD-10-CM | POA: Diagnosis not present

## 2022-12-20 DIAGNOSIS — Z419 Encounter for procedure for purposes other than remedying health state, unspecified: Secondary | ICD-10-CM | POA: Diagnosis not present

## 2023-01-20 DIAGNOSIS — Z419 Encounter for procedure for purposes other than remedying health state, unspecified: Secondary | ICD-10-CM | POA: Diagnosis not present

## 2023-02-20 DIAGNOSIS — Z419 Encounter for procedure for purposes other than remedying health state, unspecified: Secondary | ICD-10-CM | POA: Diagnosis not present

## 2023-02-23 ENCOUNTER — Other Ambulatory Visit: Payer: Self-pay

## 2023-02-23 ENCOUNTER — Emergency Department
Admission: EM | Admit: 2023-02-23 | Discharge: 2023-02-23 | Disposition: A | Discharge status: Home / Self Care | Payer: Medicaid Other | Attending: Emergency Medicine | Admitting: Emergency Medicine

## 2023-02-23 DIAGNOSIS — J101 Influenza due to other identified influenza virus with other respiratory manifestations: Secondary | ICD-10-CM | POA: Diagnosis not present

## 2023-02-23 DIAGNOSIS — R0981 Nasal congestion: Secondary | ICD-10-CM | POA: Diagnosis present

## 2023-02-23 DIAGNOSIS — Z20822 Contact with and (suspected) exposure to covid-19: Secondary | ICD-10-CM | POA: Diagnosis not present

## 2023-02-23 LAB — GROUP A STREP BY PCR: Group A Strep by PCR: NOT DETECTED

## 2023-02-23 LAB — RESP PANEL BY RT-PCR (RSV, FLU A&B, COVID)  RVPGX2
Influenza A by PCR: POSITIVE — AB
Influenza B by PCR: NEGATIVE
Resp Syncytial Virus by PCR: NEGATIVE
SARS Coronavirus 2 by RT PCR: NEGATIVE

## 2023-02-23 NOTE — ED Provider Triage Note (Signed)
 Emergency Medicine Provider Triage Evaluation Note  Monica Washington , a 43 y.o. female  was evaluated in triage. Patient complains of URI sx, chills, bodyaches, nasal congestion.  Exposure to flu   Review of Systems  Positive: Chills, aches, congestion, nausea Negative: No diarrhea  Physical Exam  There were no vitals taken for this visit. Gen:   Awake, no distress   Resp:  Normal effort  MSK:   Moves extremities without difficulty  Other:    Medical Decision Making  Medically screening exam initiated at 8:19 AM.  Appropriate orders placed.  Monica Washington was informed that the remainder of the evaluation will be completed by another provider, this initial triage assessment does not replace that evaluation, and the importance of remaining in the ED until their evaluation is complete.     Saunders Shona CROME, PA-C 02/23/23 [phone]

## 2023-02-23 NOTE — ED Provider Notes (Signed)
 [facility] Provider Note    Event Date/Time   First MD Initiated Contact with Patient 02/23/23 [phone]     (approximate)   History   Chills and Sore Throat   HPI  Monica Washington is a 43 year old female presenting to the emergency department for evaluation of bodyaches, sore throat, congestion.  Daughter recently sick with flu A.  Initially started with generalized bodyaches, now with increased nasal congestion.  No reported chest pain or shortness of breath.  No nausea, vomiting, abdominal pain, diarrhea.  No fevers.     Physical Exam   Triage Vital Signs: ED Triage Vitals  Encounter Vitals Group     BP 02/23/23 0825 105/84     Systolic BP Percentile --      Diastolic BP Percentile --      Pulse Rate 02/23/23 0825 97     Resp 02/23/23 0825 16     Temp 02/23/23 0825 98.9 F (37.2 C)     Temp Source 02/23/23 0825 Oral     SpO2 02/23/23 0825 98 %     Weight 02/23/23 0827 240 lb (108.9 kg)     Height 02/23/23 0827 5' 6 (1.676 m)     Head Circumference --      Peak Flow --      Pain Score 02/23/23 0824 9     Pain Loc --      Pain Education --      Exclude from Growth Chart --     Most recent vital signs: Vitals:   02/23/23 0825  BP: 105/84  Pulse: 97  Resp: 16  Temp: 98.9 F (37.2 C)  SpO2: 98%     General: Awake, interactive  HEENT: No significant swelling or erythema of the posterior oropharynx, no tonsillar exudates or uvular deviation CV:  Regular rate, good peripheral perfusion.  Resp:  Unlabored respirations.  Abd:  Nondistended.  Neuro:  Symmetric facial movement, fluid speech   ED Results / Procedures / Treatments   Labs (all labs ordered are listed, but only abnormal results are displayed) Labs Reviewed  RESP PANEL BY RT-PCR (RSV, FLU A&B, COVID)  [id number] - Abnormal; Notable for the following components:      Result Value   Influenza A by PCR POSITIVE (*)    All other components within normal limits  GROUP A STREP  BY PCR     EKG EKG independently reviewed interpreted by myself (ER attending) demonstrates:    RADIOLOGY Imaging independently reviewed and interpreted by myself demonstrates:    PROCEDURES:  Critical Care performed: No  Procedures   MEDICATIONS ORDERED IN ED: Medications - No data to display   IMPRESSION / MDM / ASSESSMENT AND PLAN / ED COURSE  I reviewed the triage vital signs and the nursing notes.  Differential diagnosis includes, but is not limited to, viral illness, low suspicion for superimposed pneumonia based on clinical history and exam  Patient's presentation is most consistent with acute illness / injury with system symptoms.  43 year old female presenting to the emergency department for evaluation of bodyaches and upper respiratory symptoms.  Stable vitals.  Flu test positive.  Outside window for Tamiflu.  Discussed supportive care.  Strict return precautions provided.  Patient discharged in stable condition.      FINAL CLINICAL IMPRESSION(S) / ED DIAGNOSES   Final diagnoses:  Influenza A     Rx / DC Orders   ED Discharge Orders     None  Note:  This document was prepared using Dragon voice recognition software and may include unintentional dictation errors.   Levander Slate, MD 02/23/23 1014

## 2023-02-23 NOTE — ED Notes (Addendum)
See triage notes. Patient c/o flu like symptoms since Friday.

## 2023-02-23 NOTE — Discharge Instructions (Signed)
You can use over-the-counter medicine to help with your symptoms.  Follow-up with your primary care doctor for further evaluation.  Return to the ER for new or worsening symptoms.

## 2023-02-23 NOTE — ED Triage Notes (Signed)
Pt to ED for URI, recent flu A exposure. Sore throat since Friday. Having chills, body aches. Also eyes are sore. Nasal congestion.

## 2023-03-20 DIAGNOSIS — Z419 Encounter for procedure for purposes other than remedying health state, unspecified: Secondary | ICD-10-CM | POA: Diagnosis not present

## 2023-04-15 ENCOUNTER — Ambulatory Visit (INDEPENDENT_AMBULATORY_CARE_PROVIDER_SITE_OTHER): Admitting: Physician Assistant

## 2023-04-15 ENCOUNTER — Encounter: Payer: Self-pay | Admitting: Physician Assistant

## 2023-04-15 VITALS — BP 136/84 | HR 81 | Temp 97.2°F | Ht 66.0 in | Wt 241.5 lb

## 2023-04-15 DIAGNOSIS — R7303 Prediabetes: Secondary | ICD-10-CM

## 2023-04-15 DIAGNOSIS — M545 Low back pain, unspecified: Secondary | ICD-10-CM | POA: Diagnosis not present

## 2023-04-15 DIAGNOSIS — E669 Obesity, unspecified: Secondary | ICD-10-CM

## 2023-04-15 DIAGNOSIS — G8929 Other chronic pain: Secondary | ICD-10-CM

## 2023-04-15 DIAGNOSIS — M25512 Pain in left shoulder: Secondary | ICD-10-CM | POA: Diagnosis not present

## 2023-04-15 DIAGNOSIS — M25562 Pain in left knee: Secondary | ICD-10-CM | POA: Diagnosis not present

## 2023-04-15 LAB — POCT GLYCOSYLATED HEMOGLOBIN (HGB A1C): Hemoglobin A1C: 5.8 % — AB (ref 4.0–5.6)

## 2023-04-15 MED ORDER — CYCLOBENZAPRINE HCL 10 MG PO TABS: 10.0000 mg | ORAL_TABLET | Freq: Three times a day (TID) | ORAL | 0 refills | Status: AC | PRN

## 2023-04-15 MED ORDER — WEGOVY 0.25 MG/0.5ML ~~LOC~~ SOAJ: 0.2500 mg | SUBCUTANEOUS | 1 refills | Status: DC

## 2023-04-15 NOTE — Patient Instructions (Signed)
 It was great to see you!  We will try to get [facility] started  A referral has been placed for you to see one of our fantastic providers at [facility]. Someone from their office will be in touch soon regarding scheduling your appointment.  Their location:  [facility] at [facility]  [address] on the 1st floor Phone number [phone] Fax [phone].   This location is across the street from the entrance to [employer] and in the same complex as the [facility]   Let's follow-up in 3 months, sooner if you have concerns.  Take care,  Jarold Motto PA-C

## 2023-04-15 NOTE — Progress Notes (Signed)
 Monica Washington is a 43 y.o. female here for a new problem.  History of Present Illness:   Chief Complaint  Patient presents with   Back Pain    Pt c/o low back pain x 2 years, radiates into buttocks, worse past 6 months.   Knee Pain    Pt c/o left knee pain x several months.   Shoulder Pain    Pt c/o left shoulder pain x 3 months, pain with moving arm back.   Joint Pain/ Back Pain Patient complains of lower back pain that has persisted for the past 2 years but has been worsening over the past 6 months.  Pain tends to radiate into her buttocks.  Associate symptoms include knee pain that has persisted for the past few months as well. She states that she has difficulty sleeping on her back and often has to rotate slowly during her sleep to relief the pain.  Patient states that she often feels leg weakness when she first wakes up and gets out of bed, stating that she often has to wait a minute or two before walking. she has been taking ibuprofen and cyclobenzaprine 10 mg which seems to relief her symptoms. 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 Currently, stays in a car for hours at a time.   Shoulder pain  Furthermore, she complains of left shoulder pain that has persisted over the past 2-3 months. Pain tends to worsen with any internal rotation motion. No concerns or pain when laying on her shoulder.   Denies any grip loss or weakness.  Agreeable to a sports medicine referral.   Pre-diabetes/Weight Gain  A1c is 5.8 today  Patient has recently gained over 20 pounds since last year.  She also states that she has gained weight since then but attributes that to less activity levels and diet changes. She states that her diet recently consisted mostly of fast food due to the nature of her work, however she is now eating more balanced meals. She has started doing calisthenics at home and  increased her walking Does report a family history of diabetes in father.  Patient is interested in medical interventions at this time.  Denies any GI symptoms   Past Medical History:  Diagnosis Date   Acute back pain with radiculopathy 11/27/2021   Back pain    Palpitations    PVC (premature ventricular contraction)      Social History   Tobacco Use   Smoking status: Former    Current packs/day: 0.00    Types: Cigarettes    Quit date: 01/19/2013    Years since quitting: 10.2   Smokeless tobacco: Never  Vaping Use   Vaping status: Some Days  Substance Use Topics   Alcohol use: Not Currently    Comment: cannot tolerate   Drug use: Never    No past surgical history on file.  Family History  Problem Relation Age of Onset   Healthy Mother    Diabetes Father    Breast cancer Maternal Aunt        72s   Colon cancer Neg Hx     No Known Allergies  Current Medications:   Current Outpatient Medications:    cyclobenzaprine (FLEXERIL) 10 MG tablet, Take 1 tablet (10 mg total) by mouth 3 (three) times daily as needed for muscle spasms., Disp: 30 tablet, Rfl: 0   ibuprofen (ADVIL) 200 MG tablet,  Take 800 mg by mouth every 6 (six) hours as needed., Disp: , Rfl:    Semaglutide-Weight Management (WEGOVY) 0.25 MG/0.5ML SOAJ, Inject 0.25 mg into the skin once a week., Disp: 2 mL, Rfl: 1   Review of Systems:   Review of Systems  Gastrointestinal: Negative.   Musculoskeletal:  Positive for back pain and joint pain.  Negative unless otherwise specified per HPI.  Vitals:   Vitals:   04/15/23 0830  BP: 136/84  Pulse: 81  Temp: (!) 97.2 F (36.2 C)  TempSrc: Temporal  SpO2: 97%  Weight: 241 lb 8 oz (109.5 kg)  Height: 5\' 6"  (1.676 m)     Body mass index is 38.98 kg/m.  Physical Exam:   Physical Exam Constitutional:      Appearance: Normal appearance. She is well-developed.  HENT:     Head: Normocephalic and atraumatic.  Eyes:     General: Lids are normal.      Extraocular Movements: Extraocular movements intact.     Conjunctiva/sclera: Conjunctivae normal.  Pulmonary:     Effort: Pulmonary effort is normal.  Musculoskeletal:        General: Normal range of motion.     Cervical back: Normal range of motion and neck supple.     Comments: Normal range of motion of back and knees  Left arm with decreased range of motion with rotation; no change in range of motion with adduction/abduction  Skin:    General: Skin is warm and dry.  Neurological:     Mental Status: She is alert and oriented to person, place, and time.  Psychiatric:        Attention and Perception: Attention and perception normal.        Mood and Affect: Mood normal.        Behavior: Behavior normal.        Thought Content: Thought content normal.        Judgment: Judgment normal.    Results for orders placed or performed in visit on 04/15/23  POCT HgB A1C  Result Value Ref Range   Hemoglobin A1C 5.8 (A) 4.0 - 5.6 %     Assessment and Plan:   Chronic bilateral low back pain without sciatica No red flags She would like to work on treating her weight and then continue to monitor this Continue as needed use of ibuprofen Will resend flexeril 10 mg three times daily as needed -- sleepy precautions advised Follow up in 3 month(s), sooner if concerns  Chronic pain of left knee No red flags She would like to work on treating her weight and then continue to monitor this Continue as needed use of ibuprofen Will resend flexeril 10 mg three times daily as needed -- sleepy precautions advised Follow up in 3 month(s), sooner if concerns  Chronic left shoulder pain Referral to sports medicine for further evaluation  Prediabetes/Obesity Hemoglobin A1c improved Recommend glp-1 for weight loss and ongoing blood sugar control Start Wegovy 0.25 mg weekly -- risks and benefits/side effect(s) discussed Follow up in 3 month(s) Continue efforts at healthy lifestyle   Jarold Motto, PA-C  I,Safa M Kadhim,acting as a scribe for Jarold Motto, PA.,have documented all relevant documentation on the behalf of Jarold Motto, PA,as directed by  Jarold Motto, PA while in the presence of Jarold Motto, [place].   I, Jarold Motto, [place], have reviewed all documentation for this visit. The documentation on 04/15/23 for the exam, diagnosis, procedures, and orders are all accurate and complete.

## 2023-04-16 ENCOUNTER — Other Ambulatory Visit (HOSPITAL_COMMUNITY): Payer: Self-pay

## 2023-04-16 ENCOUNTER — Telehealth: Payer: Self-pay

## 2023-04-16 NOTE — Telephone Encounter (Signed)
 Pharmacy Patient Advocate Encounter   Received notification from Onbase that prior authorization for Wegovy 0.25MG /0.5ML auto-injectors is required/requested.   Insurance verification completed.   The patient is insured through [facility] [place] .   Per test claim: PA required; PA submitted to above mentioned insurance via CoverMyMeds Key/confirmation #/EOC [id number] Status is pending

## 2023-04-16 NOTE — Telephone Encounter (Signed)
 Pharmacy Patient Advocate Encounter  Received notification from [facility] Medicaid that Prior Authorization for [facility] 0.25MG /0.5ML auto-injectors has been APPROVED from 03/258/25 to 10/13/23. Ran test claim, Copay is $4. This test claim was processed through [facility]- copay amounts may vary at other pharmacies due to pharmacy/plan contracts, or as the patient moves through the different stages of their insurance plan.   PA #/Case ID/Reference #:  [id number]

## 2023-04-19 ENCOUNTER — Ambulatory Visit (INDEPENDENT_AMBULATORY_CARE_PROVIDER_SITE_OTHER): Admitting: Family Medicine

## 2023-04-19 ENCOUNTER — Other Ambulatory Visit: Payer: Self-pay

## 2023-04-19 ENCOUNTER — Ambulatory Visit (INDEPENDENT_AMBULATORY_CARE_PROVIDER_SITE_OTHER)

## 2023-04-19 ENCOUNTER — Encounter: Payer: Self-pay | Admitting: Family Medicine

## 2023-04-19 VITALS — BP 116/78 | HR 77 | Ht 66.0 in | Wt 243.0 lb

## 2023-04-19 DIAGNOSIS — M25512 Pain in left shoulder: Secondary | ICD-10-CM

## 2023-04-19 DIAGNOSIS — G8929 Other chronic pain: Secondary | ICD-10-CM

## 2023-04-19 DIAGNOSIS — M545 Low back pain, unspecified: Secondary | ICD-10-CM | POA: Diagnosis not present

## 2023-04-19 DIAGNOSIS — M19012 Primary osteoarthritis, left shoulder: Secondary | ICD-10-CM | POA: Diagnosis not present

## 2023-04-19 DIAGNOSIS — M47816 Spondylosis without myelopathy or radiculopathy, lumbar region: Secondary | ICD-10-CM | POA: Diagnosis not present

## 2023-04-19 NOTE — Patient Instructions (Addendum)
 Thank you for coming in today.   Please get an Xray today before you leave   I've referred you to Physical Therapy.  Let us know if you don't hear from them in one week.   Check back in 6 weeks

## 2023-04-19 NOTE — Progress Notes (Signed)
 I, Stevenson Clinch, CMA acting as a scribe for Clementeen Graham, MD.  Monica Washington is a 43 y.o. female who presents to [facility] at [facility] today for L shoulder pain x 2-3 months. Pt locates pain to anterior aspect of the shoulder. Denies injury. Pt is RHD. Limited ROM reaching back, stiffness with overhead reaching. Will have n/t with left side-lying.   Radiates: denies radicular sx Aggravates: ROM, side-lying Treatments tried: IBU  Pt also c/o LBP x 2 years. Pt locates pain to bilateral lower back, has responded well to IM injection in the past. Cyclobenzaprine.   Radiating pain: left knee LE numbness/tingling: no LE weakness: left knee Aggravates: driving, prolonged sitting.  Treatments tried: IBU, cyclobenzaprine  Pertinent review of systems: No fevers or chills  Relevant historical information: Insulin resistance   Exam:  BP 116/78   Pulse 77   Ht 5\' 6"  (1.676 m)   Wt 243 lb (110.2 kg)   LMP 04/03/2023 (Exact Date)   SpO2 99%   BMI 39.22 kg/m  General: Well Developed, well nourished, and in no acute distress.   MSK: Left shoulder normal-appearing Decreased range of motion to internal rotation.  Functional internal rotation limited to posterior iliac crest. Abduction and external rotation motion are intact. Strength is intact without pain. Negative impingement testing.  L-spine nontender to palpation midline normal lumbar motion lower extremity strength is intact.    Lab and Radiology Results No results found for this or any previous visit (from the past 72 hours). DG Lumbar Spine 2-3 Views Result Date: 04/19/2023 CLINICAL DATA:  Low back pain for several years. EXAM: LUMBAR SPINE - 2-3 VIEW COMPARISON:  None Available. FINDINGS: There is no evidence of lumbar spine fracture. Minimal levocurvature of lumbar spine. Minimal narrow intervertebral space in the lower lumbar spine. IMPRESSION: Minimal degenerative joint changes of lower lumbar  spine. Electronically Signed   By: Sherian Rein M.D.   On: 04/19/2023 11:43   I, Clementeen Graham, personally (independently) visualized and performed the interpretation of the images attached in this note.  X-ray images left shoulder obtained today personally and independently interpreted. No acute fractures no severe DJD. Await formal radiology review   Assessment and Plan: 43 y.o. female with chronic left shoulder pain.  She has symptoms that are partially consistent with rotator cuff tendinopathy and partially consistent with early adhesive capsulitis.  She would like to avoid an injection today which I think is reasonable.  Plan for physical therapy.  Recheck in about 6 weeks...  Chronic low back pain due to mild degenerative changes and muscle dysfunction.  Again plan for PT.  Use cyclobenzaprine as needed as already prescribed.  Recheck 6 weeks.   PDMP not reviewed this encounter. Orders Placed This Encounter  Procedures   [place] LIMITED JOINT SPACE STRUCTURES UP LEFT(NO LINKED CHARGES)    Reason for Exam (SYMPTOM  OR DIAGNOSIS REQUIRED):   left shoulder pain    Preferred imaging location?:   [facility] Lumbar Spine 2-3 Views    Standing Status:   Future    Number of Occurrences:   1    Expiration Date:   05/19/2023    Reason for Exam (SYMPTOM  OR DIAGNOSIS REQUIRED):   low back pain    Preferred imaging location?:   [place]    Is patient pregnant?:   No   DG Shoulder Left    Standing Status:   Future  Number of Occurrences:   1    Expiration Date:   05/19/2023    Reason for Exam (SYMPTOM  OR DIAGNOSIS REQUIRED):   left shoulder pain    Preferred imaging location?:   [place]    Is patient pregnant?:   No   Ambulatory referral to Physical Therapy    Referral Priority:   Routine    Referral Type:   Physical Medicine    Referral Reason:   Specialty Services Required    Requested Specialty:   Physical Therapy    Number of  Visits Requested:   1   No orders of the defined types were placed in this encounter.    Discussed warning signs or symptoms. Please see discharge instructions. Patient expresses understanding.   The above documentation has been reviewed and is accurate and complete Clementeen Graham, M.D.

## 2023-04-20 ENCOUNTER — Encounter: Payer: Self-pay | Admitting: Family Medicine

## 2023-04-20 NOTE — Progress Notes (Signed)
 Low back x-ray shows a little bit of arthritis

## 2023-04-26 ENCOUNTER — Other Ambulatory Visit: Payer: Self-pay | Admitting: Physician Assistant

## 2023-04-26 ENCOUNTER — Encounter: Payer: Self-pay | Admitting: Family Medicine

## 2023-04-26 DIAGNOSIS — Z1231 Encounter for screening mammogram for malignant neoplasm of breast: Secondary | ICD-10-CM

## 2023-04-26 NOTE — Progress Notes (Signed)
 Left shoulder x-ray shows a little bit of arthritis at the small joint at the top of the shoulder.  This is the [facility] joint.

## 2023-04-30 ENCOUNTER — Ambulatory Visit: Attending: Family Medicine

## 2023-04-30 ENCOUNTER — Other Ambulatory Visit: Payer: Self-pay

## 2023-04-30 DIAGNOSIS — M545 Low back pain, unspecified: Secondary | ICD-10-CM | POA: Diagnosis not present

## 2023-04-30 DIAGNOSIS — M5459 Other low back pain: Secondary | ICD-10-CM | POA: Diagnosis not present

## 2023-04-30 DIAGNOSIS — G8929 Other chronic pain: Secondary | ICD-10-CM | POA: Insufficient documentation

## 2023-04-30 DIAGNOSIS — M25612 Stiffness of left shoulder, not elsewhere classified: Secondary | ICD-10-CM | POA: Insufficient documentation

## 2023-04-30 DIAGNOSIS — M25512 Pain in left shoulder: Secondary | ICD-10-CM | POA: Insufficient documentation

## 2023-04-30 NOTE — Therapy (Signed)
 OUTPATIENT PHYSICAL THERAPY SHOULDER EVALUATION   Patient Name: Monica Washington MRN: [id number] DOB:[birth year], 43 y.o., female Today's Date: 04/30/2023  END OF SESSION:  PT End of Session - 04/30/23 1300     Visit Number 1    Number of Visits 12    Date for PT Re-Evaluation 06/25/23    Authorization Type [facility]    PT Start Time 1302    PT Stop Time 1342    PT Time Calculation (min) 40 min    Activity Tolerance Patient tolerated treatment well    Behavior During Therapy [facility] for tasks assessed/performed             Past Medical History:  Diagnosis Date   Acute back pain with radiculopathy 11/27/2021   Back pain    Palpitations    PVC (premature ventricular contraction)    History reviewed. No pertinent surgical history. Patient Active Problem List   Diagnosis Date Noted   Chronic bilateral low back pain without sciatica 04/19/2023   Insulin resistance 03/27/2022   PCP: Jarold Motto, PA  REFERRING PROVIDER: Rodolph Bong, MD  REFERRING DIAG:  [phone] (ICD-10-CM) - Chronic left shoulder pain  M54.50,G89.29 (ICD-10-CM) - Chronic bilateral low back pain without sciatica   THERAPY DIAG:  Left shoulder pain, unspecified chronicity  Stiffness of left shoulder, not elsewhere classified  Other low back pain  Rationale for Evaluation and Treatment: Rehabilitation  ONSET DATE: 04/19/2023 date of referral   SUBJECTIVE:                                                                                                                                                                                      SUBJECTIVE STATEMENT: Patient reports to PT d/t history of L shoulder pain that has been present for about 3 months without known injury. She reports that she has pain with reaching behind her back, putting on her bra and other upper body dressing. 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 She additionally  reports history of LBP that has been present since she had her daughter 7 years ago. She has history of injection for pain management in the past. She would like to focus today's evaluation on her shoulder, but is open to treating her back at a later session.   Referring provider notes that her L shoulder pain seems to be partially consistent with RTC tendinopathy and early adhesive capsulitis. He also notes that her chronic low back pain seems to be related to mild degenerative changes and muscle dysfunction. He would like her to follow up in 6 weeks from  last appt.   Hand dominance: Right  PERTINENT HISTORY: Relevant PMHx including PVC, chronic low back pain  PAIN:  Are you having pain?  Yes: NPRS scale: 5/10 current, 10/10 worst  Pain location: L shoulder  Pain description: aching Aggravating factors: sleeping, reaching behind back, upper body dressing Relieving factors: unsure   PRECAUTIONS: None  RED FLAGS: None   WEIGHT BEARING RESTRICTIONS: No  FALLS:  Has patient fallen in last 6 months? No  LIVING ENVIRONMENT: Lives with: lives with their family Lives in: House/apartment Stairs: Yes: External: 1 flight steps; can reach both Has following equipment at home: None  OCCUPATION: Transportation   PLOF: Independent  PATIENT GOALS: To be able to get dressed and put on her seatbelt without shoulder pain. Improve low back pain if possible.   NEXT MD VISIT: 05/31/2023  OBJECTIVE:  Note: Objective measures were completed at Evaluation unless otherwise noted.  DIAGNOSTIC FINDINGS:  "CLINICAL DATA:  Shoulder pain EXAM: LEFT SHOULDER - 2+ VIEW COMPARISON:  None Available. FINDINGS: No fracture or malalignment.  Mild AC joint degenerative change IMPRESSION: Mild AC joint degenerative change. Electronically Signed   By: Jasmine Pang M.D."  "DG Lumbar Spine 2-3 Views Result Date: 04/19/2023 CLINICAL DATA:  Low back pain for several years. EXAM: LUMBAR SPINE - 2-3 VIEW  COMPARISON:  None Available. FINDINGS: There is no evidence of lumbar spine fracture. Minimal levocurvature of lumbar spine. Minimal narrow intervertebral space in the lower lumbar spine. IMPRESSION: Minimal degenerative joint changes of lower lumbar spine. Electronically Signed   By: Sherian Rein M.D."  PATIENT SURVEYS:  Quick Dash 18  COGNITION: Overall cognitive status: Within functional limits for tasks assessed     SENSATION: Not tested  POSTURE:  No significant postural abnormalities noted with unsupported sitting during subjective assessment  UPPER EXTREMITY ROM: note: patient has most pain and at least 50% degrees ROM deficit with function IR reach (behind back) and ER with 90 degrees of shoulder abduction, as performed in seated position.   Active ROM Right eval Left eval  Shoulder flexion [facility]  Shoulder extension (seated) 40 20  Shoulder abduction [facility]  Shoulder adduction    Shoulder internal rotation [facility]  Shoulder external rotation [facility]  Elbow flexion    Elbow extension    Wrist flexion    Wrist extension    Wrist ulnar deviation    Wrist radial deviation    Wrist pronation    Wrist supination    (Blank rows = not tested)  UPPER EXTREMITY MMT:  MMT Right eval Left eval  Shoulder flexion 5 4+  Shoulder extension     Shoulder abduction 5 4+  Shoulder adduction    Shoulder internal rotation 5 4  Shoulder external rotation 5 4  Middle trapezius 4 4-  Lower trapezius 4 4-  Elbow flexion    Elbow extension    Wrist flexion    Wrist extension    Wrist ulnar deviation    Wrist radial deviation    Wrist pronation    Wrist supination    Grip strength (lbs)    (Blank rows = not tested)   SHOULDER SPECIAL TESTS: Impingement tests: Neer impingement test: negative and Hawkins/Kennedy impingement test: negative Rotator cuff assessment: Drop arm test: negative and Belly press test: negative  TREATMENT DATE:   [facility] Adult PT Treatment:                                                DATE: 04/30/2023   Initial evaluation: see patient education and home exercise program as noted below      PATIENT EDUCATION: Education details: reviewed initial home exercise program; discussion of POC, prognosis and goals for skilled PT   Person educated: Patient Education method: Explanation, Demonstration, and Handouts Education comprehension: verbalized understanding, returned demonstration, and needs further education  HOME EXERCISE PROGRAM: Access Code: [email or web address] URL: [email or web address] Date: 04/30/2023 Prepared by: Mauri Reading  Exercises - Standing Isometric Shoulder Extension with Doorway - Arm Bent  - 1 x daily - 3 x weekly - 3 sets - 5 reps - 5 sec hold - Low Trap Setting at Wall  - 1 x daily - 7 x weekly - 3 sets - 5 reps - 2 sec hold - Prone Shoulder Horizontal Abduction on Swiss Ball  - 1 x daily - 7 x weekly - 3 sets - 5 reps - 2 sec hold  Patient Education - Ice - Heat  ASSESSMENT:  CLINICAL IMPRESSION: Anays is a 42 y.o. female who was seen today for physical therapy evaluation and treatment for Left shoulder pain with mobility deficits. She additionally has persistent low back pain that is to be assessed and treated at later point in her POC. Today, she is demonstrating diminished left shoulder extension AROM, pain with functional IR reach behind back, pain with ER at 90 degrees of shoulder abduction, and decreased periscapular mm strength. She has related pain and difficulty with upper body dressing, reaching for seatbelt in the car, and impaired sleep quality d/t waking with pain. She requires skilled PT services at this time to address relevant deficits and improve overall function.     OBJECTIVE IMPAIRMENTS: decreased mobility, decreased ROM, decreased strength, impaired UE  functional use, and pain.   ACTIVITY LIMITATIONS: sleeping, bathing, dressing, reach over head, and hygiene/grooming  PARTICIPATION LIMITATIONS: driving and occupation  PERSONAL FACTORS: Past/current experiences, Profession, Time since onset of injury/illness/exacerbation, and 1 comorbidity: PVC, chronic low back pain  are also affecting patient's functional outcome.   REHAB POTENTIAL: Fair    CLINICAL DECISION MAKING: Stable/uncomplicated  EVALUATION COMPLEXITY: Low   GOALS: Goals reviewed with patient? Yes  SHORT TERM GOALS: Target date: 05/21/2023   Patient will be independent with initial home program at least 3 days/week.  Baseline: provided at eval  Goal status: INITIAL  2.  Patient will demonstrate improved L shoulder extension AROM to at least 30 degrees to improved ability to reach behind back for ADLs and driving.  Baseline: see objective measures  Goal status: INITIAL    LONG TERM GOALS: Target date: 06/11/2023    Patient will report improved overall functional ability with Quick DASH score of 9 or lower  Baseline: 18 Goal status: INITIAL  2.  Patient will demonstrate ability to reach behind her back with L shoulder, higher than L1, or order to perform upper body dressing tasks.  Baseline: unable to reach higher than L5 d/t pain and stiffness Goal status: INITIAL  3.  Patient will report ability to reach seatbelt with L shoulder when getting in the car to drive as required for her occupational duties.  Baseline: unable to use L UE, reaching across  with R shoulder at time of eval  Goal status: INITIAL  4.  Patient will report no more than 5/10 pain at worst in left shoulder with daily activities.  Baseline: 10/10 at worst  Goal status: INITIAL   PLAN:  PT FREQUENCY: 1-2x/week  PT DURATION: 6 weeks  PLANNED INTERVENTIONS: 97164- PT Re-evaluation, 97110-Therapeutic exercises, 97530- Therapeutic activity, 97112- Neuromuscular re-education, 97535- Self  Care, 16109- Manual therapy, [id number]- Electrical stimulation (unattended), Patient/Family education, Taping, Dry Needling, Joint mobilization, Joint manipulation, Cryotherapy, and Moist heat   [employer] Authorization   Choose one: Rehabilitative  Standardized Assessment or Functional Outcome Tool: Quick DASH  Score or Percent Disability: 18  Body Parts Treated (Select each separately):  Shoulder. Overall deficits/functional limitations for body part selected: moderate Lumbopelvic. Overall deficits/functional limitations for body part selected: moderate    PLAN FOR NEXT SESSION: address L shoulder extension ROM and strength, periscapular strength/stabilization, manual therapy and joint mobilization as indicated (ACJ, GHJ), modalities as needed for pain modulation ; consider lumbar assessment after substantial HEP provided some meaningful progress with L shoulder with per patient report.    Mauri Reading, PT, DPT  04/30/2023 7:37 PM

## 2023-05-01 DIAGNOSIS — Z419 Encounter for procedure for purposes other than remedying health state, unspecified: Secondary | ICD-10-CM | POA: Diagnosis not present

## 2023-05-04 ENCOUNTER — Ambulatory Visit

## 2023-05-04 DIAGNOSIS — M25612 Stiffness of left shoulder, not elsewhere classified: Secondary | ICD-10-CM | POA: Diagnosis not present

## 2023-05-04 DIAGNOSIS — M25512 Pain in left shoulder: Secondary | ICD-10-CM | POA: Diagnosis not present

## 2023-05-04 DIAGNOSIS — M5459 Other low back pain: Secondary | ICD-10-CM | POA: Diagnosis not present

## 2023-05-04 DIAGNOSIS — M545 Low back pain, unspecified: Secondary | ICD-10-CM | POA: Diagnosis not present

## 2023-05-04 DIAGNOSIS — G8929 Other chronic pain: Secondary | ICD-10-CM | POA: Diagnosis not present

## 2023-05-04 NOTE — Therapy (Signed)
 OUTPATIENT PHYSICAL THERAPY NOTE   Patient Name: Monica Washington MRN: [id number] DOB:1980/03/15, 43 y.o., female Today's Date: 05/04/2023  END OF SESSION:  PT End of Session - 05/04/23 1220     Visit Number 2    Number of Visits 12    Date for PT Re-Evaluation 06/25/23    Authorization Type MCD [facility]    PT Start Time 1220    PT Stop Time 1258    PT Time Calculation (min) 38 min    Activity Tolerance Patient tolerated treatment well    Behavior During Therapy [facility] for tasks assessed/performed             Past Medical History:  Diagnosis Date   Acute back pain with radiculopathy 11/27/2021   Back pain    Palpitations    PVC (premature ventricular contraction)    No past surgical history on file. Patient Active Problem List   Diagnosis Date Noted   Chronic bilateral low back pain without sciatica 04/19/2023   Insulin resistance 03/27/2022   PCP: Alexander Iba, PA  REFERRING PROVIDER: Syliva Even, MD  REFERRING DIAG:  [id number] (ICD-10-CM) - Chronic left shoulder pain  M54.50,G89.29 (ICD-10-CM) - Chronic bilateral low back pain without sciatica   THERAPY DIAG:  Left shoulder pain, unspecified chronicity  Rationale for Evaluation and Treatment: Rehabilitation  ONSET DATE: 04/19/2023 date of referral   SUBJECTIVE:                                                                                                                                                                                      SUBJECTIVE STATEMENT: 05/05/2023 Patient reports that she noticed a warm sensation in her L shoulder twice since her last visit. Stating it felt weak also, but didn't give out on her. She last noticed it while driving, but had no problem using her arm.    EVAL: Patient reports to PT d/t history of L shoulder pain that has been present for about 3 months without known injury. She reports that she has pain with reaching behind her back, putting on her bra and other  upper body dressing. 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 She additionally reports history of LBP that has been present since she had her daughter 7 years ago. She has history of injection for pain management in the past. She would like to focus today's evaluation on her shoulder, but is open to treating her back at a later session. Referring provider notes that her L shoulder pain seems to be partially consistent with RTC tendinopathy and early adhesive capsulitis. He  also notes that her chronic low back pain seems to be related to mild degenerative changes and muscle dysfunction. He would like her to follow up in 6 weeks from last appt.   Hand dominance: Right  PERTINENT HISTORY: Relevant PMHx including PVC, chronic low back pain  PAIN:  Are you having pain?  Yes: NPRS scale: 5/10 current, 10/10 worst  Pain location: L shoulder  Pain description: aching Aggravating factors: sleeping, reaching behind back, upper body dressing Relieving factors: unsure   PRECAUTIONS: None  RED FLAGS: None   WEIGHT BEARING RESTRICTIONS: No  FALLS:  Has patient fallen in last 6 months? No  LIVING ENVIRONMENT: Lives with: lives with their family Lives in: House/apartment Stairs: Yes: External: 1 flight steps; can reach both Has following equipment at home: None  OCCUPATION: Transportation   PLOF: Independent  PATIENT GOALS: To be able to get dressed and put on her seatbelt without shoulder pain. Improve low back pain if possible.   NEXT MD VISIT: 05/31/2023  OBJECTIVE:  Note: Objective measures were completed at Evaluation unless otherwise noted.  DIAGNOSTIC FINDINGS:  "CLINICAL DATA:  Shoulder pain EXAM: LEFT SHOULDER - 2+ VIEW COMPARISON:  None Available. FINDINGS: No fracture or malalignment.  Mild AC joint degenerative change IMPRESSION: Mild AC joint degenerative change. Electronically Signed   By: Jasmine Pang  M.D."  "DG Lumbar Spine 2-3 Views Result Date: 04/19/2023 CLINICAL DATA:  Low back pain for several years. EXAM: LUMBAR SPINE - 2-3 VIEW COMPARISON:  None Available. FINDINGS: There is no evidence of lumbar spine fracture. Minimal levocurvature of lumbar spine. Minimal narrow intervertebral space in the lower lumbar spine. IMPRESSION: Minimal degenerative joint changes of lower lumbar spine. Electronically Signed   By: Sherian Rein M.D."  PATIENT SURVEYS:  Quick Dash 18  COGNITION: Overall cognitive status: Within functional limits for tasks assessed     SENSATION: Not tested  POSTURE:  No significant postural abnormalities noted with unsupported sitting during subjective assessment  UPPER EXTREMITY ROM: note: patient has most pain and at least 50% degrees ROM deficit with function IR reach (behind back) and ER with 90 degrees of shoulder abduction, as performed in seated position.   Active ROM Right eval Left eval  Shoulder flexion [facility]  Shoulder extension (seated) 40 20  Shoulder abduction [facility]  Shoulder adduction    Shoulder internal rotation [facility]  Shoulder external rotation [facility]  Elbow flexion    Elbow extension    Wrist flexion    Wrist extension    Wrist ulnar deviation    Wrist radial deviation    Wrist pronation    Wrist supination    (Blank rows = not tested)  UPPER EXTREMITY MMT:  MMT Right eval Left eval  Shoulder flexion 5 4+  Shoulder extension     Shoulder abduction 5 4+  Shoulder adduction    Shoulder internal rotation 5 4  Shoulder external rotation 5 4  Middle trapezius 4 4-  Lower trapezius 4 4-  Elbow flexion    Elbow extension    Wrist flexion    Wrist extension    Wrist ulnar deviation    Wrist radial deviation    Wrist pronation    Wrist supination    Grip strength (lbs)    (Blank rows = not tested)   SHOULDER SPECIAL TESTS: Impingement tests: Neer impingement test: negative and Hawkins/Kennedy impingement test:  negative Rotator cuff assessment: Drop arm test: negative and Belly press test: negative  TREATMENT DATE:    [facility] Adult PT Treatment:                                                DATE: 05/04/2023  Therapeutic Exercise:  Finger ladder x 5 flexion, x 5 abduction  Isometric shoulder extension and abdominal bracing with small pball, seated, 2 x 10 with 3 sec hold  Seated pball rolling flexion on treatment table for shoulder ROM and lumbar pain modulation  Standing shoulder extension pull downs with RTB 2 x 10  Standing rows with RTB 2 x 10  MHP x 6 minutes at end of session concurrent with patient education   Self-care:  Patient education regarding sleep positioning, supportive pillow and mattress, and addressing other questions regarding causes of pain, and normal pain response    [facility] Adult PT Treatment:                                                DATE: 05/04/2023   Initial evaluation: see patient education and home exercise program as noted below      PATIENT EDUCATION: Education details: reviewed initial home exercise program; discussion of POC, prognosis and goals for skilled PT   Person educated: Patient Education method: Explanation, Demonstration, and Handouts Education comprehension: verbalized understanding, returned demonstration, and needs further education  HOME EXERCISE PROGRAM: Access Code: [email or web address] URL: [email or web address] Date: 04/30/2023 Prepared by: Arlester Bence  Exercises - Standing Isometric Shoulder Extension with Doorway - Arm Bent  - 1 x daily - 3 x weekly - 3 sets - 5 reps - 5 sec hold - Low Trap Setting at Wall  - 1 x daily - 7 x weekly - 3 sets - 5 reps - 2 sec hold - Prone Shoulder Horizontal Abduction on Swiss Ball  - 1 x daily - 7 x weekly - 3 sets - 5 reps - 2 sec hold  Patient Education - Ice -  Heat  ASSESSMENT:  CLINICAL IMPRESSION:  Patient had fair tolerance of initial treatment session. She reports some relief of shoulder and lumbar symptoms with physioball rolling with  concurrent shoulder and lumbar flexion. Additional time spent today providing patient education regarding sleep, identifying supportive pillow and mattress, as well as expectations for pain response with exercises.   EVAL: Monica Washington is a 43 y.o. female who was seen today for physical therapy evaluation and treatment for Left shoulder pain with mobility deficits. She additionally has persistent low back pain that is to be assessed and treated at later point in her POC. Today, she is demonstrating diminished left shoulder extension AROM, pain with functional IR reach behind back, pain with ER at 90 degrees of shoulder abduction, and decreased periscapular mm strength. She has related pain and difficulty with upper body dressing, reaching for seatbelt in the car, and impaired sleep quality d/t waking with pain. She requires skilled PT services at this time to address relevant deficits and improve overall function.     OBJECTIVE IMPAIRMENTS: decreased mobility, decreased ROM, decreased strength, impaired UE functional use, and pain.   ACTIVITY LIMITATIONS: sleeping, bathing, dressing, reach over head, and hygiene/grooming  PARTICIPATION LIMITATIONS: driving and occupation  PERSONAL FACTORS: Past/current experiences, Profession, Time since onset of injury/illness/exacerbation, and  1 comorbidity: PVC, chronic low back pain  are also affecting patient's functional outcome.   REHAB POTENTIAL: Fair    CLINICAL DECISION MAKING: Stable/uncomplicated  EVALUATION COMPLEXITY: Low   GOALS: Goals reviewed with patient? Yes  SHORT TERM GOALS: Target date: 05/21/2023   Patient will be independent with initial home program at least 3 days/week.  Baseline: provided at eval  Goal status: INITIAL  2.  Patient will demonstrate  improved L shoulder extension AROM to at least 30 degrees to improved ability to reach behind back for ADLs and driving.  Baseline: see objective measures  Goal status: INITIAL    LONG TERM GOALS: Target date: 06/11/2023    Patient will report improved overall functional ability with Quick DASH score of 9 or lower  Baseline: 18 Goal status: INITIAL  2.  Patient will demonstrate ability to reach behind her back with L shoulder, higher than L1, or order to perform upper body dressing tasks.  Baseline: unable to reach higher than L5 d/t pain and stiffness Goal status: INITIAL  3.  Patient will report ability to reach seatbelt with L shoulder when getting in the car to drive as required for her occupational duties.  Baseline: unable to use L UE, reaching across with R shoulder at time of eval  Goal status: INITIAL  4.  Patient will report no more than 5/10 pain at worst in left shoulder with daily activities.  Baseline: 10/10 at worst  Goal status: INITIAL   PLAN:  PT FREQUENCY: 1-2x/week  PT DURATION: 6 weeks  PLANNED INTERVENTIONS: 97164- PT Re-evaluation, 97110-Therapeutic exercises, 97530- Therapeutic activity, 97112- Neuromuscular re-education, 97535- Self Care, 95621- Manual therapy, G0283- Electrical stimulation (unattended), Patient/Family education, Taping, Dry Needling, Joint mobilization, Joint manipulation, Cryotherapy, and Moist heat   [employer] Authorization   Choose one: Rehabilitative  Standardized Assessment or Functional Outcome Tool: Quick DASH  Score or Percent Disability: 18  Body Parts Treated (Select each separately):  Shoulder. Overall deficits/functional limitations for body part selected: moderate Lumbopelvic. Overall deficits/functional limitations for body part selected: moderate    PLAN FOR NEXT SESSION: address L shoulder extension ROM and strength, periscapular strength/stabilization, manual therapy and joint mobilization as indicated (ACJ,  GHJ), modalities as needed for pain modulation ; consider lumbar assessment after substantial HEP provided some meaningful progress with L shoulder with per patient report.   Arlester Bence, PT, DPT  05/05/2023 10:03 PM

## 2023-05-07 ENCOUNTER — Ambulatory Visit
Admission: RE | Admit: 2023-05-07 | Discharge: 2023-05-07 | Disposition: A | Discharge status: Home / Self Care | Source: Ambulatory Visit | Attending: Physician Assistant | Admitting: Physician Assistant

## 2023-05-07 DIAGNOSIS — Z1231 Encounter for screening mammogram for malignant neoplasm of breast: Secondary | ICD-10-CM | POA: Diagnosis not present

## 2023-05-10 ENCOUNTER — Other Ambulatory Visit (HOSPITAL_COMMUNITY): Payer: Self-pay

## 2023-05-14 ENCOUNTER — Ambulatory Visit

## 2023-05-14 DIAGNOSIS — M25612 Stiffness of left shoulder, not elsewhere classified: Secondary | ICD-10-CM

## 2023-05-14 DIAGNOSIS — M5459 Other low back pain: Secondary | ICD-10-CM | POA: Diagnosis not present

## 2023-05-14 DIAGNOSIS — M25512 Pain in left shoulder: Secondary | ICD-10-CM

## 2023-05-14 DIAGNOSIS — M545 Low back pain, unspecified: Secondary | ICD-10-CM | POA: Diagnosis not present

## 2023-05-14 DIAGNOSIS — G8929 Other chronic pain: Secondary | ICD-10-CM | POA: Diagnosis not present

## 2023-05-14 NOTE — Therapy (Signed)
 OUTPATIENT PHYSICAL THERAPY NOTE   Patient Name: Monica Washington MRN: [id number] DOB:[birth year], 43 y.o., female Today's Date: 05/14/2023  END OF SESSION:  PT End of Session - 05/14/23 1346      Visit Number 3    Number of Visits 12     Date for PT Re-Evaluation 06/25/23     Authorization Type MCD [facility]     PT Start Time 1347    PT Stop Time 1425    PT Time Calculation (min) 38 min     Activity Tolerance Patient tolerated treatment well     Behavior During Therapy [facility] for tasks assessed/performed    Past Medical History:  Diagnosis Date   Acute back pain with radiculopathy 11/27/2021   Back pain    Palpitations    PVC (premature ventricular contraction)    No past surgical history on file. Patient Active Problem List   Diagnosis Date Noted   Chronic bilateral low back pain without sciatica 04/19/2023   Insulin resistance 03/27/2022   PCP: Alexander Iba, PA  REFERRING PROVIDER: Syliva Even, MD  REFERRING DIAG:  [phone] (ICD-10-CM) - Chronic left shoulder pain  M54.50,G89.29 (ICD-10-CM) - Chronic bilateral low back pain without sciatica   THERAPY DIAG:  Left shoulder pain, unspecified chronicity  Stiffness of left shoulder, not elsewhere classified  Other low back pain  Rationale for Evaluation and Treatment: Rehabilitation  ONSET DATE: 04/19/2023 date of referral   SUBJECTIVE:                                                                                                                                                                                      SUBJECTIVE STATEMENT: 05/14/2023 Patient reports that she is feeling good today with no more than minimal shoulder and back pain. She states that she had  good sleep over the weekend at the hotel and woke up with less pain.   EVAL: Patient reports to PT d/t history of L shoulder pain that has been present for about 3 months without known injury. She reports that she has pain with reaching behind  her back, putting on her bra and other upper body dressing. 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 She additionally reports history of LBP that has been present since she had her daughter 7 years ago. She has history of injection for pain management in the past. She would like to focus today's evaluation on her shoulder, but is open to treating her back at a later session. Referring provider notes that her L shoulder pain seems to be partially consistent with RTC tendinopathy  and early adhesive capsulitis. He also notes that her chronic low back pain seems to be related to mild degenerative changes and muscle dysfunction. He would like her to follow up in 6 weeks from last appt.   Hand dominance: Right  PERTINENT HISTORY: Relevant PMHx including PVC, chronic low back pain  PAIN:  Are you having pain?  Yes: NPRS scale: 5/10 current, 10/10 worst  Pain location: L shoulder  Pain description: aching Aggravating factors: sleeping, reaching behind back, upper body dressing Relieving factors: unsure   PRECAUTIONS: None  RED FLAGS: None   WEIGHT BEARING RESTRICTIONS: No  FALLS:  Has patient fallen in last 6 months? No  LIVING ENVIRONMENT: Lives with: lives with their family Lives in: House/apartment Stairs: Yes: External: 1 flight steps; can reach both Has following equipment at home: None  OCCUPATION: Transportation   PLOF: Independent  PATIENT GOALS: To be able to get dressed and put on her seatbelt without shoulder pain. Improve low back pain if possible.   NEXT MD VISIT: 05/31/2023  OBJECTIVE:  Note: Objective measures were completed at Evaluation unless otherwise noted.  DIAGNOSTIC FINDINGS:  "CLINICAL DATA:  Shoulder pain EXAM: LEFT SHOULDER - 2+ VIEW COMPARISON:  None Available. FINDINGS: No fracture or malalignment.  Mild AC joint degenerative change IMPRESSION: Mild AC joint degenerative change. Electronically  Signed   By: Esmeralda Hedge M.D."  "DG Lumbar Spine 2-3 Views Result Date: 04/19/2023 CLINICAL DATA:  Low back pain for several years. EXAM: LUMBAR SPINE - 2-3 VIEW COMPARISON:  None Available. FINDINGS: There is no evidence of lumbar spine fracture. Minimal levocurvature of lumbar spine. Minimal narrow intervertebral space in the lower lumbar spine. IMPRESSION: Minimal degenerative joint changes of lower lumbar spine. Electronically Signed   By: Anna Barnes M.D."  PATIENT SURVEYS:  Quick Dash 18  COGNITION: Overall cognitive status: Within functional limits for tasks assessed     SENSATION: Not tested  POSTURE:  No significant postural abnormalities noted with unsupported sitting during subjective assessment  UPPER EXTREMITY ROM: note: patient has most pain and at least 50% degrees ROM deficit with function IR reach (behind back) and ER with 90 degrees of shoulder abduction, as performed in seated position.   Active ROM Right eval Left eval  Shoulder flexion [facility]  Shoulder extension (seated) 40 20  Shoulder abduction [facility]  Shoulder adduction    Shoulder internal rotation [facility]  Shoulder external rotation [facility]  Elbow flexion    Elbow extension    Wrist flexion    Wrist extension    Wrist ulnar deviation    Wrist radial deviation    Wrist pronation    Wrist supination    (Blank rows = not tested)  UPPER EXTREMITY MMT:  MMT Right eval Left eval  Shoulder flexion 5 4+  Shoulder extension     Shoulder abduction 5 4+  Shoulder adduction    Shoulder internal rotation 5 4  Shoulder external rotation 5 4  Middle trapezius 4 4-  Lower trapezius 4 4-  Elbow flexion    Elbow extension    Wrist flexion    Wrist extension    Wrist ulnar deviation    Wrist radial deviation    Wrist pronation    Wrist supination    Grip strength (lbs)    (Blank rows = not tested)   SHOULDER SPECIAL TESTS: Impingement tests: Neer impingement test: negative and  Hawkins/Kennedy impingement test: negative Rotator cuff assessment: Drop arm test: negative and  Belly press test: negative                                                                                                                              TREATMENT DATE:    [facility] Adult PT Treatment:                                                DATE: 05/14/2023  Therapeutic Exercise:  UBE x 5 minutes fwd, level 2 Finger ladder x 8 flexion, x 8 abduction  Seated pball rolling flexion for shoulder ROM and lumbar pain modulation  10x forward 2 x 10 right diagonal Standing shoulder extension pull downs with GTB 2 x 10  Standing rows with RTB 2 x 10  Patient education regarding benefits of supportive pillow and mattress, and addressing other questions regarding causes of pain, and normal pain response to exercise      PATIENT EDUCATION: Education details: reviewed initial home exercise program; discussion of POC, prognosis and goals for skilled PT   Person educated: Patient Education method: Explanation, Demonstration, and Handouts Education comprehension: verbalized understanding, returned demonstration, and needs further education  HOME EXERCISE PROGRAM: Access Code: [email or web address] URL: [email or web address] Date: 04/30/2023 Prepared by: Arlester Bence  Exercises - Standing Isometric Shoulder Extension with Doorway - Arm Bent  - 1 x daily - 3 x weekly - 3 sets - 5 reps - 5 sec hold - Low Trap Setting at Wall  - 1 x daily - 7 x weekly - 3 sets - 5 reps - 2 sec hold - Prone Shoulder Horizontal Abduction on Swiss Ball  - 1 x daily - 7 x weekly - 3 sets - 5 reps - 2 sec hold  Patient Education - Ice - Heat  ASSESSMENT:  CLINICAL IMPRESSION:  Patient had good tolerance of initial treatment session. She reports some muscle fatigue at end of session. Her pain remains minimal. We will continue to progress exercises as tolerated in order to progress towards established rehab  goals.    EVAL: Brandye is a 43 y.o. female who was seen today for physical therapy evaluation and treatment for Left shoulder pain with mobility deficits. She additionally has persistent low back pain that is to be assessed and treated at later point in her POC. Today, she is demonstrating diminished left shoulder extension AROM, pain with functional IR reach behind back, pain with ER at 90 degrees of shoulder abduction, and decreased periscapular mm strength. She has related pain and difficulty with upper body dressing, reaching for seatbelt in the car, and impaired sleep quality d/t waking with pain. She requires skilled PT services at this time to address relevant deficits and improve overall function.     OBJECTIVE IMPAIRMENTS: decreased mobility, decreased ROM, decreased strength, impaired UE functional use, and pain.   ACTIVITY LIMITATIONS:  sleeping, bathing, dressing, reach over head, and hygiene/grooming  PARTICIPATION LIMITATIONS: driving and occupation  PERSONAL FACTORS: Past/current experiences, Profession, Time since onset of injury/illness/exacerbation, and 1 comorbidity: PVC, chronic low back pain  are also affecting patient's functional outcome.   REHAB POTENTIAL: Fair    CLINICAL DECISION MAKING: Stable/uncomplicated  EVALUATION COMPLEXITY: Low   GOALS: Goals reviewed with patient? Yes  SHORT TERM GOALS: Target date: 05/21/2023   Patient will be independent with initial home program at least 3 days/week.  Baseline: provided at eval  Goal status: INITIAL  2.  Patient will demonstrate improved L shoulder extension AROM to at least 30 degrees to improved ability to reach behind back for ADLs and driving.  Baseline: see objective measures  Goal status: INITIAL    LONG TERM GOALS: Target date: 06/11/2023    Patient will report improved overall functional ability with Quick DASH score of 9 or lower  Baseline: 18 Goal status: INITIAL  2.  Patient will demonstrate  ability to reach behind her back with L shoulder, higher than L1, or order to perform upper body dressing tasks.  Baseline: unable to reach higher than L5 d/t pain and stiffness Goal status: INITIAL  3.  Patient will report ability to reach seatbelt with L shoulder when getting in the car to drive as required for her occupational duties.  Baseline: unable to use L UE, reaching across with R shoulder at time of eval  Goal status: INITIAL  4.  Patient will report no more than 5/10 pain at worst in left shoulder with daily activities.  Baseline: 10/10 at worst  Goal status: INITIAL   PLAN:  PT FREQUENCY: 1-2x/week  PT DURATION: 6 weeks  PLANNED INTERVENTIONS: 97164- PT Re-evaluation, 97110-Therapeutic exercises, 97530- Therapeutic activity, 97112- Neuromuscular re-education, 97535- Self Care, 82956- Manual therapy, G0283- Electrical stimulation (unattended), Patient/Family education, Taping, Dry Needling, Joint mobilization, Joint manipulation, Cryotherapy, and Moist heat   [employer] Authorization   Choose one: Rehabilitative  Standardized Assessment or Functional Outcome Tool: Quick DASH  Score or Percent Disability: 18  Body Parts Treated (Select each separately):  Shoulder. Overall deficits/functional limitations for body part selected: moderate Lumbopelvic. Overall deficits/functional limitations for body part selected: moderate    PLAN FOR NEXT SESSION: address L shoulder extension ROM and strength, periscapular strength/stabilization, manual therapy and joint mobilization as indicated (ACJ, GHJ), modalities as needed for pain modulation ; consider lumbar assessment after substantial HEP provided some meaningful progress with L shoulder with per patient report.   Arlester Bence, PT, DPT  05/16/2023 3:06 PM

## 2023-05-21 ENCOUNTER — Ambulatory Visit: Attending: Family Medicine

## 2023-05-21 DIAGNOSIS — M25512 Pain in left shoulder: Secondary | ICD-10-CM

## 2023-05-21 DIAGNOSIS — M25612 Stiffness of left shoulder, not elsewhere classified: Secondary | ICD-10-CM

## 2023-05-21 DIAGNOSIS — M5459 Other low back pain: Secondary | ICD-10-CM | POA: Diagnosis not present

## 2023-05-21 NOTE — Therapy (Signed)
 OUTPATIENT PHYSICAL THERAPY NOTE   Patient Name: Monica Washington MRN: [id number] DOB:03-09-1980, 43 y.o., female Today's Date: 05/21/2023  END OF SESSION:  PT End of Session - 05/21/23 1353     Visit Number 4    Number of Visits 12    Date for PT Re-Evaluation 06/25/23    Authorization Type [facility]    PT Start Time 1349    PT Stop Time 1430    PT Time Calculation (min) 41 min    Activity Tolerance Patient tolerated treatment well    Behavior During Therapy [facility] for tasks assessed/performed              Past Medical History:  Diagnosis Date   Acute back pain with radiculopathy 11/27/2021   Back pain    Palpitations    PVC (premature ventricular contraction)    No past surgical history on file. Patient Active Problem List   Diagnosis Date Noted   Chronic bilateral low back pain without sciatica 04/19/2023   Insulin resistance 03/27/2022   PCP: Alexander Iba, PA  REFERRING PROVIDER: Syliva Even, MD  REFERRING DIAG:  [phone] (ICD-10-CM) - Chronic left shoulder pain  M54.50,G89.29 (ICD-10-CM) - Chronic bilateral low back pain without sciatica   THERAPY DIAG:  Left shoulder pain, unspecified chronicity  Stiffness of left shoulder, not elsewhere classified  Other low back pain  Rationale for Evaluation and Treatment: Rehabilitation  ONSET DATE: 04/19/2023 date of referral   SUBJECTIVE:                                                                                                                                                                                      SUBJECTIVE STATEMENT: 05/21/2023 Patient reporting no more than moderate pain levels today in shoulder. She has not noticed back pain lately.    EVAL: Patient reports to PT d/t history of L shoulder pain that has been present for about 3 months without known injury. She reports that she has pain with reaching behind her back, putting on her bra and other upper body dressing. She also has  pain reaching back to pull her seat belt down to drive, which she does for work. She additionally reports history of LBP that has been present since she had her daughter 7 years ago. She has history of injection for pain management in the past. She would like to focus today's evaluation on her shoulder, but is open to treating her back at a later session. Referring provider notes that her L shoulder pain seems to be partially consistent with RTC tendinopathy and early adhesive capsulitis. He also notes that her chronic low back pain seems  to be related to mild degenerative changes and muscle dysfunction. He would like her to follow up in 6 weeks from last appt.   Hand dominance: Right  PERTINENT HISTORY: Relevant PMHx including PVC, chronic low back pain  PAIN:  Are you having pain?  Yes: NPRS scale: 5/10 current, 10/10 worst  Pain location: L shoulder  Pain description: aching Aggravating factors: sleeping, reaching behind back, upper body dressing Relieving factors: unsure   PRECAUTIONS: None  RED FLAGS: None   WEIGHT BEARING RESTRICTIONS: No  FALLS:  Has patient fallen in last 6 months? No  LIVING ENVIRONMENT: Lives with: lives with their family Lives in: House/apartment Stairs: Yes: External: 1 flight steps; can reach both Has following equipment at home: None  OCCUPATION: Transportation   PLOF: Independent  PATIENT GOALS: To be able to get dressed and put on her seatbelt without shoulder pain. Improve low back pain if possible.   NEXT MD VISIT: 05/31/2023  OBJECTIVE:  Note: Objective measures were completed at Evaluation unless otherwise noted.  DIAGNOSTIC FINDINGS:  "CLINICAL DATA:  Shoulder pain EXAM: LEFT SHOULDER - 2+ VIEW COMPARISON:  None Available. FINDINGS: No fracture or malalignment.  Mild AC joint degenerative change IMPRESSION: Mild AC joint degenerative change. Electronically Signed   By: Esmeralda Hedge M.D."  "DG Lumbar Spine 2-3 Views Result  Date: 04/19/2023 CLINICAL DATA:  Low back pain for several years. EXAM: LUMBAR SPINE - 2-3 VIEW COMPARISON:  None Available. FINDINGS: There is no evidence of lumbar spine fracture. Minimal levocurvature of lumbar spine. Minimal narrow intervertebral space in the lower lumbar spine. IMPRESSION: Minimal degenerative joint changes of lower lumbar spine. Electronically Signed   By: Anna Barnes M.D."  PATIENT SURVEYS:  Quick Dash 18  COGNITION: Overall cognitive status: Within functional limits for tasks assessed     SENSATION: Not tested  POSTURE:  No significant postural abnormalities noted with unsupported sitting during subjective assessment  UPPER EXTREMITY ROM: note: patient has most pain and at least 50% degrees ROM deficit with function IR reach (behind back) and ER with 90 degrees of shoulder abduction, as performed in seated position.   Active ROM Right eval Left eval  Shoulder flexion [facility]  Shoulder extension (seated) 40 20  Shoulder abduction [facility]  Shoulder adduction    Shoulder internal rotation [facility]  Shoulder external rotation [facility]  Elbow flexion    Elbow extension    Wrist flexion    Wrist extension    Wrist ulnar deviation    Wrist radial deviation    Wrist pronation    Wrist supination    (Blank rows = not tested)  UPPER EXTREMITY MMT:  MMT Right eval Left eval  Shoulder flexion 5 4+  Shoulder extension     Shoulder abduction 5 4+  Shoulder adduction    Shoulder internal rotation 5 4  Shoulder external rotation 5 4  Middle trapezius 4 4-  Lower trapezius 4 4-  Elbow flexion    Elbow extension    Wrist flexion    Wrist extension    Wrist ulnar deviation    Wrist radial deviation    Wrist pronation    Wrist supination    Grip strength (lbs)    (Blank rows = not tested)   SHOULDER SPECIAL TESTS: Impingement tests: Neer impingement test: negative and Hawkins/Kennedy impingement test: negative Rotator cuff assessment: Drop arm test:  negative and Belly press test: negative  TREATMENT DATE:    [facility] Adult PT Treatment:                                                DATE: 05/21/2023  Therapeutic Exercise:  UBE  level 2, fwd 2 minutes/back 2 minutes  Finger ladder x 8 flexion, x 8 abduction  Seated pball rolling flexion for shoulder ROM and lumbar pain modulation  10x forward 2 x 10 right diagonal Standing shoulder extension pull downs with GTB 2 x 10  Standing rows with GTB 2 x 10 Shoulder IR stretching at wall, 1 round with stretch strap  Scap setting wall slides with RTB       PATIENT EDUCATION: Education details: reviewed initial home exercise program; discussion of POC, prognosis and goals for skilled PT   Person educated: Patient Education method: Explanation, Demonstration, and Handouts Education comprehension: verbalized understanding, returned demonstration, and needs further education  HOME EXERCISE PROGRAM: Access Code: [email or web address] URL: [email or web address] Date: 04/30/2023 Prepared by: Arlester Bence  Exercises - Standing Isometric Shoulder Extension with Doorway - Arm Bent  - 1 x daily - 3 x weekly - 3 sets - 5 reps - 5 sec hold - Low Trap Setting at Wall  - 1 x daily - 7 x weekly - 3 sets - 5 reps - 2 sec hold - Prone Shoulder Horizontal Abduction on Swiss Ball  - 1 x daily - 7 x weekly - 3 sets - 5 reps - 2 sec hold  Patient Education - Ice - Heat  ASSESSMENT:  CLINICAL IMPRESSION:  05/21/2023 Patient had good tolerance of initial treatment session. She reports some muscle fatigue at end of session. Her IR AAROM is improved following stretches today. We will continue to progress exercises as tolerated in order to progress towards established rehab goals.    EVAL: Aslee is a 43 y.o. female who was seen today for physical therapy evaluation and treatment  for Left shoulder pain with mobility deficits. She additionally has persistent low back pain that is to be assessed and treated at later point in her POC. Today, she is demonstrating diminished left shoulder extension AROM, pain with functional IR reach behind back, pain with ER at 90 degrees of shoulder abduction, and decreased periscapular mm strength. She has related pain and difficulty with upper body dressing, reaching for seatbelt in the car, and impaired sleep quality d/t waking with pain. She requires skilled PT services at this time to address relevant deficits and improve overall function.     OBJECTIVE IMPAIRMENTS: decreased mobility, decreased ROM, decreased strength, impaired UE functional use, and pain.   ACTIVITY LIMITATIONS: sleeping, bathing, dressing, reach over head, and hygiene/grooming  PARTICIPATION LIMITATIONS: driving and occupation  PERSONAL FACTORS: Past/current experiences, Profession, Time since onset of injury/illness/exacerbation, and 1 comorbidity: PVC, chronic low back pain  are also affecting patient's functional outcome.   REHAB POTENTIAL: Fair    CLINICAL DECISION MAKING: Stable/uncomplicated  EVALUATION COMPLEXITY: Low   GOALS: Goals reviewed with patient? Yes  SHORT TERM GOALS: Target date: 05/21/2023   Patient will be independent with initial home program at least 3 days/week.  Baseline: provided at eval  Goal status: INITIAL  2.  Patient will demonstrate improved L shoulder extension AROM to at least 30 degrees to improved ability to reach behind back for ADLs and driving.  Baseline: see objective measures  Goal status: INITIAL    LONG TERM GOALS: Target date: 06/11/2023    Patient will report improved overall functional ability with Quick DASH score of 9 or lower  Baseline: 18 Goal status: INITIAL  2.  Patient will demonstrate ability to reach behind her back with L shoulder, higher than L1, or order to perform upper body dressing tasks.   Baseline: unable to reach higher than L5 d/t pain and stiffness Goal status: INITIAL  3.  Patient will report ability to reach seatbelt with L shoulder when getting in the car to drive as required for her occupational duties.  Baseline: unable to use L UE, reaching across with R shoulder at time of eval  Goal status: INITIAL  4.  Patient will report no more than 5/10 pain at worst in left shoulder with daily activities.  Baseline: 10/10 at worst  Goal status: INITIAL   PLAN:  PT FREQUENCY: 1-2x/week  PT DURATION: 6 weeks  PLANNED INTERVENTIONS: 97164- PT Re-evaluation, 97110-Therapeutic exercises, 97530- Therapeutic activity, 97112- Neuromuscular re-education, 97535- Self Care, 42595- Manual therapy, [id number]- Electrical stimulation (unattended), Patient/Family education, Taping, Dry Needling, Joint mobilization, Joint manipulation, Cryotherapy, and Moist heat   [employer] Authorization   Choose one: Rehabilitative  Standardized Assessment or Functional Outcome Tool: Quick DASH  Score or Percent Disability: 18  Body Parts Treated (Select each separately):  Shoulder. Overall deficits/functional limitations for body part selected: moderate Lumbopelvic. Overall deficits/functional limitations for body part selected: moderate    PLAN FOR NEXT SESSION: address L shoulder extension ROM and strength, periscapular strength/stabilization, manual therapy and joint mobilization as indicated (ACJ, GHJ), modalities as needed for pain modulation ; consider lumbar assessment after substantial HEP provided some meaningful progress with L shoulder with per patient report.   Arlester Bence, PT, DPT  05/22/2023 12:32 PM

## 2023-05-28 ENCOUNTER — Ambulatory Visit

## 2023-05-31 ENCOUNTER — Ambulatory Visit (INDEPENDENT_AMBULATORY_CARE_PROVIDER_SITE_OTHER): Admitting: Family Medicine

## 2023-05-31 VITALS — BP 114/78 | HR 80 | Wt 240.0 lb

## 2023-05-31 DIAGNOSIS — M25512 Pain in left shoulder: Secondary | ICD-10-CM

## 2023-05-31 DIAGNOSIS — G8929 Other chronic pain: Secondary | ICD-10-CM

## 2023-05-31 DIAGNOSIS — Z419 Encounter for procedure for purposes other than remedying health state, unspecified: Secondary | ICD-10-CM | POA: Diagnosis not present

## 2023-05-31 NOTE — Progress Notes (Signed)
   Joanna Muck, PhD, LAT, ATC acting as a scribe for Garlan Juniper, MD.  Monica Washington is a 43 y.o. female who presents to [facility] today for f/u L shoulder and low back pain. Pt was last seen by Dr. Alease Hunter on 04/19/23 and was advised to use cyclobenzaprine , prn, as already prescribed, and was referred to PT, completing 4 visits.  Today, pt reports she notes increased AROM in her shoulder, but cont'd pain around the [facility] joint, but not as severe. Low back pain has resolved.   Dx imaging: 04/19/23 L shoulder & L-spine XR  Pertinent review of systems: No fevers or chills  Relevant historical information: Insulin resistance and obesity.   Exam:  BP 114/78   Pulse 80   Wt 240 lb (108.9 kg)   SpO2 98%   BMI 38.74 kg/m  General: Well Developed, well nourished, and in no acute distress.   MSK: Left shoulder normal-appearing normal motion intact strength.      Assessment and Plan: 43 y.o. female with chronic shoulder pain improving with PT.  Back pain has resolved. Plan to continue and finish out PT and continue home exercise program.  Next step if not good enough would be either injection or MRI.  Patient will keep me updated.  PDMP not reviewed this encounter. No orders of the defined types were placed in this encounter.  No orders of the defined types were placed in this encounter.    Discussed warning signs or symptoms. Please see discharge instructions. Patient expresses understanding.   The above documentation has been reviewed and is accurate and complete Garlan Juniper, M.D.

## 2023-05-31 NOTE — Patient Instructions (Signed)
 Thank you for coming in today.   Keep working with PT and continue home exercises.   If not good enough next step would be an injection or an MRI. Let me know if we need to do that.

## 2023-06-03 ENCOUNTER — Encounter: Payer: Self-pay | Admitting: Physician Assistant

## 2023-06-04 ENCOUNTER — Ambulatory Visit

## 2023-06-04 DIAGNOSIS — M5459 Other low back pain: Secondary | ICD-10-CM | POA: Diagnosis not present

## 2023-06-04 DIAGNOSIS — M25612 Stiffness of left shoulder, not elsewhere classified: Secondary | ICD-10-CM | POA: Diagnosis not present

## 2023-06-04 DIAGNOSIS — M25512 Pain in left shoulder: Secondary | ICD-10-CM | POA: Diagnosis not present

## 2023-06-04 MED ORDER — WEGOVY 0.5 MG/0.5ML ~~LOC~~ SOAJ: 0.5000 mg | SUBCUTANEOUS | 0 refills | Status: DC

## 2023-06-04 NOTE — Therapy (Signed)
 OUTPATIENT PHYSICAL THERAPY NOTE   Patient Name: Monica Washington MRN: [id number] DOB:[birth year], 43 y.o., female Today's Date: 06/04/2023  END OF SESSION:  PT End of Session - 06/04/23 1304     Visit Number 5    Number of Visits 12    Date for PT Re-Evaluation 06/25/23    Authorization Type M[facility]    PT Start Time 1301    PT Stop Time 1339    PT Time Calculation (min) 38 min    Activity Tolerance Patient tolerated treatment well    Behavior During Therapy [facility] for tasks assessed/performed               Past Medical History:  Diagnosis Date   Acute back pain with radiculopathy 11/27/2021   Back pain    Palpitations    PVC (premature ventricular contraction)    History reviewed. No pertinent surgical history. Patient Active Problem List   Diagnosis Date Noted   Chronic bilateral low back pain without sciatica 04/19/2023   Insulin resistance 03/27/2022   PCP: Alexander Iba, PA  REFERRING PROVIDER: Syliva Even, MD  REFERRING DIAG:  [phone] (ICD-10-CM) - Chronic left shoulder pain  M54.50,G89.29 (ICD-10-CM) - Chronic bilateral low back pain without sciatica   THERAPY DIAG:  Left shoulder pain, unspecified chronicity  Stiffness of left shoulder, not elsewhere classified  Rationale for Evaluation and Treatment: Rehabilitation  ONSET DATE: 04/19/2023 date of referral   SUBJECTIVE:                                                                                                                                                                                      SUBJECTIVE STATEMENT: 06/04/2023 Patient reporting minimal pain since last visit. She was seen by referring provider on Monday, with reported plan to finish PT. She would like to avoid injection and MRI if possible. She feels that she is ready to be done with PT at next visit.   EVAL: Patient reports to PT d/t history of L shoulder pain that has been present for about 3 months without known  injury. She reports that she has pain with reaching behind her back, putting on her bra and other upper body dressing. 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 She additionally reports history of LBP that has been present since she had her daughter 7 years ago. She has history of injection for pain management in the past. She would like to focus today's evaluation on her shoulder, but is open to treating her back at a later session. Referring provider notes that her L shoulder pain  seems to be partially consistent with RTC tendinopathy and early adhesive capsulitis. He also notes that her chronic low back pain seems to be related to mild degenerative changes and muscle dysfunction. He would like her to follow up in 6 weeks from last appt.   Hand dominance: Right  PERTINENT HISTORY: Relevant PMHx including PVC, chronic low back pain  PAIN:  Are you having pain?  Yes: NPRS scale: 5/10 current, 10/10 worst  Pain location: L shoulder  Pain description: aching Aggravating factors: sleeping, reaching behind back, upper body dressing Relieving factors: unsure   PRECAUTIONS: None  RED FLAGS: None   WEIGHT BEARING RESTRICTIONS: No  FALLS:  Has patient fallen in last 6 months? No  LIVING ENVIRONMENT: Lives with: lives with their family Lives in: House/apartment Stairs: Yes: External: 1 flight steps; can reach both Has following equipment at home: None  OCCUPATION: Transportation   PLOF: Independent  PATIENT GOALS: To be able to get dressed and put on her seatbelt without shoulder pain. Improve low back pain if possible.   NEXT MD VISIT: 05/31/2023  OBJECTIVE:  Note: Objective measures were completed at Evaluation unless otherwise noted.  DIAGNOSTIC FINDINGS:  "CLINICAL DATA:  Shoulder pain EXAM: LEFT SHOULDER - 2+ VIEW COMPARISON:  None Available. FINDINGS: No fracture or malalignment.  Mild AC joint degenerative  change IMPRESSION: Mild AC joint degenerative change. Electronically Signed   By: Esmeralda Hedge M.D."  "DG Lumbar Spine 2-3 Views Result Date: 04/19/2023 CLINICAL DATA:  Low back pain for several years. EXAM: LUMBAR SPINE - 2-3 VIEW COMPARISON:  None Available. FINDINGS: There is no evidence of lumbar spine fracture. Minimal levocurvature of lumbar spine. Minimal narrow intervertebral space in the lower lumbar spine. IMPRESSION: Minimal degenerative joint changes of lower lumbar spine. Electronically Signed   By: Anna Barnes M.D."  PATIENT SURVEYS:  Quick Dash 18  COGNITION: Overall cognitive status: Within functional limits for tasks assessed     SENSATION: Not tested  POSTURE:  No significant postural abnormalities noted with unsupported sitting during subjective assessment  UPPER EXTREMITY ROM: note: patient has most pain and at least 50% degrees ROM deficit with function IR reach (behind back) and ER with 90 degrees of shoulder abduction, as performed in seated position.   Active ROM Right eval Left eval  Shoulder flexion [facility]  Shoulder extension (seated) 40 20  Shoulder abduction [facility]  Shoulder adduction    Shoulder internal rotation [facility]  Shoulder external rotation [facility]  Elbow flexion    Elbow extension    Wrist flexion    Wrist extension    Wrist ulnar deviation    Wrist radial deviation    Wrist pronation    Wrist supination    (Blank rows = not tested)  UPPER EXTREMITY MMT:  MMT Right eval Left eval  Shoulder flexion 5 4+  Shoulder extension     Shoulder abduction 5 4+  Shoulder adduction    Shoulder internal rotation 5 4  Shoulder external rotation 5 4  Middle trapezius 4 4-  Lower trapezius 4 4-  Elbow flexion    Elbow extension    Wrist flexion    Wrist extension    Wrist ulnar deviation    Wrist radial deviation    Wrist pronation    Wrist supination    Grip strength (lbs)    (Blank rows = not tested)   SHOULDER SPECIAL  TESTS: Impingement tests: Neer impingement test: negative and Hawkins/Kennedy impingement test: negative  Rotator cuff assessment: Drop arm test: negative and Belly press test: negative                                                                                                                              TREATMENT DATE:    [facility] Adult PT Treatment:                                                DATE: 06/04/2023  Therapeutic Exercise:  UBE level 1, fwd 2 minutes Finger ladder x 8 flexion, x 8 abduction  Standing shoulder extension pull downs with  blue TB 2 x 10  Standing rows with blue TB 2 x 10 Isometric IR/ER walkouts with GTB 2 x 6 each        PATIENT EDUCATION: Education details: reviewed initial home exercise program; discussion of POC, prognosis and goals for skilled PT   Person educated: Patient Education method: Explanation, Demonstration, and Handouts Education comprehension: verbalized understanding, returned demonstration, and needs further education  HOME EXERCISE PROGRAM: Access Code: [email or web address] URL: [email or web address] Date: 04/30/2023 Prepared by: Arlester Bence  Exercises - Standing Isometric Shoulder Extension with Doorway - Arm Bent  - 1 x daily - 3 x weekly - 3 sets - 5 reps - 5 sec hold - Low Trap Setting at Wall  - 1 x daily - 7 x weekly - 3 sets - 5 reps - 2 sec hold - Prone Shoulder Horizontal Abduction on Swiss Ball  - 1 x daily - 7 x weekly - 3 sets - 5 reps - 2 sec hold  Patient Education - Ice - Heat  ASSESSMENT:  CLINICAL IMPRESSION:  06/04/2023 Monica Washington had good tolerance of today's treatment session, which focused on progression of shoulder strengthening program, including increased resistance. Patient had some difficulty with resisted internal and external rotation strengthening today. Plan to reassess progress towards established goals at next visit and discuss plan for discharge from PT.    EVAL: Monica Washington is a 43 y.o.  female who was seen today for physical therapy evaluation and treatment for Left shoulder pain with mobility deficits. She additionally has persistent low back pain that is to be assessed and treated at later point in her POC. Today, she is demonstrating diminished left shoulder extension AROM, pain with functional IR reach behind back, pain with ER at 90 degrees of shoulder abduction, and decreased periscapular mm strength. She has related pain and difficulty with upper body dressing, reaching for seatbelt in the car, and impaired sleep quality d/t waking with pain. She requires skilled PT services at this time to address relevant deficits and improve overall function.     OBJECTIVE IMPAIRMENTS: decreased mobility, decreased ROM, decreased strength, impaired UE functional use, and pain.   ACTIVITY LIMITATIONS: sleeping, bathing, dressing, reach over head, and hygiene/grooming  PARTICIPATION  LIMITATIONS: driving and occupation  PERSONAL FACTORS: Past/current experiences, Profession, Time since onset of injury/illness/exacerbation, and 1 comorbidity: PVC, chronic low back pain are also affecting patient's functional outcome.   REHAB POTENTIAL: Fair    CLINICAL DECISION MAKING: Stable/uncomplicated  EVALUATION COMPLEXITY: Low   GOALS: Goals reviewed with patient? Yes  SHORT TERM GOALS: Target date: 05/21/2023   Patient will be independent with initial home program at least 3 days/week.  Baseline: provided at eval  Goal status: INITIAL  2.  Patient will demonstrate improved L shoulder extension AROM to at least 30 degrees to improved ability to reach behind back for ADLs and driving.  Baseline: see objective measures  Goal status: INITIAL    LONG TERM GOALS: Target date: 06/11/2023    Patient will report improved overall functional ability with Quick DASH score of 9 or lower  Baseline: 18 Goal status: INITIAL  2.  Patient will demonstrate ability to reach behind her back with L  shoulder, higher than L1, or order to perform upper body dressing tasks.  Baseline: unable to reach higher than L5 d/t pain and stiffness Goal status: INITIAL  3.  Patient will report ability to reach seatbelt with L shoulder when getting in the car to drive as required for her occupational duties.  Baseline: unable to use L UE, reaching across with R shoulder at time of eval  Goal status: INITIAL  4.  Patient will report no more than 5/10 pain at worst in left shoulder with daily activities.  Baseline: 10/10 at worst  Goal status: INITIAL   PLAN:  PT FREQUENCY: 1-2x/week  PT DURATION: 6 weeks  PLANNED INTERVENTIONS: 97164- PT Re-evaluation, 97110-Therapeutic exercises, 97530- Therapeutic activity, 97112- Neuromuscular re-education, 97535- Self Care, 16109- Manual therapy, G0283- Electrical stimulation (unattended), Patient/Family education, Taping, Dry Needling, Joint mobilization, Joint manipulation, Cryotherapy, and Moist heat   [employer] Authorization   Choose one: Rehabilitative  Standardized Assessment or Functional Outcome Tool: Quick DASH  Score or Percent Disability: 18  Body Parts Treated (Select each separately):  Shoulder. Overall deficits/functional limitations for body part selected: moderate Lumbopelvic. Overall deficits/functional limitations for body part selected: moderate    PLAN FOR NEXT SESSION: address L shoulder extension ROM and strength, periscapular strength/stabilization, manual therapy and joint mobilization as indicated (ACJ, GHJ), modalities as needed for pain modulation ; consider lumbar assessment after substantial HEP provided some meaningful progress with L shoulder with per patient report.   Arlester Bence, PT, DPT  06/04/2023 1:45 PM

## 2023-06-11 ENCOUNTER — Ambulatory Visit

## 2023-06-11 DIAGNOSIS — M5459 Other low back pain: Secondary | ICD-10-CM | POA: Diagnosis not present

## 2023-06-11 DIAGNOSIS — M25612 Stiffness of left shoulder, not elsewhere classified: Secondary | ICD-10-CM

## 2023-06-11 DIAGNOSIS — M25512 Pain in left shoulder: Secondary | ICD-10-CM

## 2023-06-11 NOTE — Therapy (Signed)
 OUTPATIENT PHYSICAL THERAPY NOTE   Patient Name: Monica Washington MRN: [id number] DOB:October 16, 1980, 43 y.o., female Today's Date: 06/11/2023  PHYSICAL THERAPY DISCHARGE SUMMARY  Visits from Start of Care: 6   Current functional level related to goals / functional outcomes: See objective findings/assessment   Remaining deficits: See objective findings/assessment    Education / Equipment: See today's treatment/assessment      Patient agrees to discharge. Patient goals were met. Patient is being discharged due to being pleased with the current functional level.    END OF SESSION:    PT End of Session - 06/11/23 1308     Visit Number 6    Number of Visits 12    Date for PT Re-Evaluation 06/25/23    Authorization Type MCD [facility]    PT Start Time 0105    PT Stop Time 0139    PT Time Calculation (min) 34 min    Activity Tolerance Patient tolerated treatment well    Behavior During Therapy [facility] for tasks assessed/performed               Past Medical History:  Diagnosis Date   Acute back pain with radiculopathy 11/27/2021   Back pain    Palpitations    PVC (premature ventricular contraction)    No past surgical history on file. Patient Active Problem List   Diagnosis Date Noted   Chronic bilateral low back pain without sciatica 04/19/2023   Insulin resistance 03/27/2022   PCP: Alexander Iba, PA  REFERRING PROVIDER: Syliva Even, MD  REFERRING DIAG:  [phone] (ICD-10-CM) - Chronic left shoulder pain  M54.50,G89.29 (ICD-10-CM) - Chronic bilateral low back pain without sciatica   THERAPY DIAG:  Left shoulder pain, unspecified chronicity  Stiffness of left shoulder, not elsewhere classified  Other low back pain  Rationale for Evaluation and Treatment: Rehabilitation  ONSET DATE: 04/19/2023 date of referral   SUBJECTIVE:                                                                                                                                                                                       SUBJECTIVE STATEMENT: 06/11/2023 Patient reporting that she is happy with her current progress. She states that her shoulder is feeling much better and she is able to reach behind her back, lifting her arm, and put on seatbelt normally. She continues to have minimal pain at end range, but feels ready to finish PT today. She states that her low back remains un symptomatic.   Hand dominance: Right  PERTINENT HISTORY: Relevant PMHx including PVC, chronic low back pain  PAIN:  Are you having pain?  Yes: NPRS scale: 5/10  current, 10/10 worst at time of eval Pain location: L shoulder  Pain description: aching Aggravating factors: sleeping, reaching behind back, upper body dressing Relieving factors: unsure   PRECAUTIONS: None  RED FLAGS: None   WEIGHT BEARING RESTRICTIONS: No  FALLS:  Has patient fallen in last 6 months? No  LIVING ENVIRONMENT: Lives with: lives with their family Lives in: House/apartment Stairs: Yes: External: 1 flight steps; can reach both Has following equipment at home: None  OCCUPATION: Transportation   PLOF: Independent  PATIENT GOALS: To be able to get dressed and put on her seatbelt without shoulder pain. Improve low back pain if possible.   NEXT MD VISIT: 05/31/2023  OBJECTIVE:  Note: Objective measures were completed at Evaluation unless otherwise noted.  DIAGNOSTIC FINDINGS:  "CLINICAL DATA:  Shoulder pain EXAM: LEFT SHOULDER - 2+ VIEW COMPARISON:  None Available. FINDINGS: No fracture or malalignment.  Mild AC joint degenerative change IMPRESSION: Mild AC joint degenerative change. Electronically Signed   By: Esmeralda Hedge M.D."  "DG Lumbar Spine 2-3 Views Result Date: 04/19/2023 CLINICAL DATA:  Low back pain for several years. EXAM: LUMBAR SPINE - 2-3 VIEW COMPARISON:  None Available. FINDINGS: There is no evidence of lumbar spine fracture. Minimal levocurvature of lumbar spine.  Minimal narrow intervertebral space in the lower lumbar spine. IMPRESSION: Minimal degenerative joint changes of lower lumbar spine. Electronically Signed   By: Anna Barnes M.D."  PATIENT SURVEYS:  Quick Dash 18  COGNITION: Overall cognitive status: Within functional limits for tasks assessed     SENSATION: Not tested  POSTURE:  No significant postural abnormalities noted with unsupported sitting during subjective assessment  UPPER EXTREMITY ROM: note: patient has most pain and at least 50% degrees ROM deficit with function IR reach (behind back) and ER with 90 degrees of shoulder abduction, as performed in seated position.   Active ROM Right eval Left eval Right 06/11/23 Left 06/11/23  Shoulder flexion [facility] WFL  Shoulder extension (seated) 40 20 40 35  Shoulder abduction [facility]    Shoulder adduction      Shoulder internal rotation [facility]    Shoulder external rotation [facility]    Elbow flexion      Elbow extension      Wrist flexion      Wrist extension      Wrist ulnar deviation      Wrist radial deviation      Wrist pronation      Wrist supination      (Blank rows = not tested)  UPPER EXTREMITY MMT:  MMT Right eval Left eval Right 06/11/23 Left 06/11/23  Shoulder flexion 5 4+ 5 5  Shoulder extension       Shoulder abduction 5 4+ 5 5  Shoulder adduction      Shoulder internal rotation 5 4 5 5   Shoulder external rotation 5 4 5 5   Middle trapezius 4 4- 4+ 4+  Lower trapezius 4 4- 4 4  Elbow flexion      Elbow extension      Wrist flexion      Wrist extension      Wrist ulnar deviation      Wrist radial deviation      Wrist pronation      Wrist supination      Grip strength (lbs)      (Blank rows = not tested)   SHOULDER SPECIAL TESTS: Impingement tests: Neer impingement test: negative and Hawkins/Kennedy impingement test: negative Rotator  cuff assessment: Drop arm test: negative and Belly press test: negative                                                                                                                               TREATMENT DATE:    [facility] Adult PT Treatment:                                                DATE: 06/11/2023  Therapeutic Activity:  Reassessment of objective measures and subjective assessment regarding progress towards established goals and plan for maintenance following d/c from skilled PT today     PATIENT EDUCATION: Education details: reviewed initial home exercise program; discussion of POC, prognosis and goals for skilled PT   Person educated: Patient Education method: Explanation, Demonstration, and Handouts Education comprehension: verbalized understanding, returned demonstration, and needs further education  HOME EXERCISE PROGRAM: Access Code: [email or web address] URL: [email or web address] Date: 04/30/2023 Prepared by: Arlester Bence  Exercises - Standing Isometric Shoulder Extension with Doorway - Arm Bent  - 1 x daily - 3 x weekly - 3 sets - 5 reps - 5 sec hold - Low Trap Setting at Wall  - 1 x daily - 7 x weekly - 3 sets - 5 reps - 2 sec hold - Prone Shoulder Horizontal Abduction on Swiss Ball  - 1 x daily - 7 x weekly - 3 sets - 5 reps - 2 sec hold  Patient Education - Ice - Heat  ASSESSMENT:  CLINICAL IMPRESSION:  06/11/2023 Jonika has attended 5 visits since initial evaluation. Patient has made good progress with shoulder AROM and UE strength, and has met all rehab goals. She continue to have minimal pain with end range of motion activities, and should continue with prescribed home program. Patient will be discharged from skilled PT at this time and should follow up with referring provider as needed.     EVAL: Ashira is a 43 y.o. female who was seen today for physical therapy evaluation and treatment for Left shoulder pain with mobility deficits. She additionally has persistent low back pain that is to be assessed and treated at later point in her POC. Today, she is  demonstrating diminished left shoulder extension AROM, pain with functional IR reach behind back, pain with ER at 90 degrees of shoulder abduction, and decreased periscapular mm strength. She has related pain and difficulty with upper body dressing, reaching for seatbelt in the car, and impaired sleep quality d/t waking with pain. She requires skilled PT services at this time to address relevant deficits and improve overall function.     OBJECTIVE IMPAIRMENTS: decreased mobility, decreased ROM, decreased strength, impaired UE functional use, and pain.   ACTIVITY LIMITATIONS: sleeping, bathing, dressing, reach over head, and hygiene/grooming  PARTICIPATION LIMITATIONS: driving and occupation  PERSONAL FACTORS: Past/current experiences, Profession,  Time since onset of injury/illness/exacerbation, and 1 comorbidity: PVC, chronic low back pain are also affecting patient's functional outcome.   REHAB POTENTIAL: Fair    CLINICAL DECISION MAKING: Stable/uncomplicated  EVALUATION COMPLEXITY: Low   GOALS: Goals reviewed with patient? Yes  SHORT TERM GOALS: Target date: 05/21/2023   Patient will be independent with initial home program at least 3 days/week.  Baseline: provided at eval  Goal status: MET  2.  Patient will demonstrate improved L shoulder extension AROM to at least 30 degrees to improved ability to reach behind back for ADLs and driving.  Baseline: see objective measures  Goal status: MET     LONG TERM GOALS: Target date: 06/11/2023    Patient will report improved overall functional ability with Quick DASH score of 9 or lower  Baseline: 18 06/11/23: 16 Goal status: NOT MET  2.  Patient will demonstrate ability to reach behind her back with L shoulder, higher than L1, or order to perform upper body dressing tasks.  Baseline: unable to reach higher than L5 d/t pain and stiffness 06/11/23: able to reach >L3 Goal status: MET  3.  Patient will report ability to reach seatbelt  with L shoulder when getting in the car to drive as required for her occupational duties.  Baseline: unable to use L UE, reaching across with R shoulder at time of eval  06/11/23: able to reach withminimal pain at end range Goal status: MET  4.  Patient will report no more than 5/10 pain at worst in left shoulder with daily activities.  Baseline: 10/10 at worst  06/11/23: 0-3/10 throughout the last week.  Goal status: MET   PLAN:  PT FREQUENCY: 1-2x/week  PT DURATION: 6 weeks  PLANNED INTERVENTIONS: 97164- PT Re-evaluation, 97110-Therapeutic exercises, 97530- Therapeutic activity, 97112- Neuromuscular re-education, 97535- Self Care, 16010- Manual therapy, G0283- Electrical stimulation (unattended), Patient/Family education, Taping, Dry Needling, Joint mobilization, Joint manipulation, Cryotherapy, and Moist heat   [employer] Authorization   Choose one: Rehabilitative  Standardized Assessment or Functional Outcome Tool: Quick DASH  Score or Percent Disability: 18  Body Parts Treated (Select each separately):  Shoulder. Overall deficits/functional limitations for body part selected: moderate Lumbopelvic. Overall deficits/functional limitations for body part selected: moderate    PLAN FOR NEXT SESSION:d/c, return to referring provider as needed  Arlester Bence, PT, DPT  06/12/2023 1:38 PM

## 2023-06-28 ENCOUNTER — Other Ambulatory Visit: Payer: Self-pay | Admitting: Physician Assistant

## 2023-06-29 MED ORDER — WEGOVY 1 MG/0.5ML ~~LOC~~ SOAJ: 1.0000 mg | SUBCUTANEOUS | 0 refills | Status: DC

## 2023-07-01 DIAGNOSIS — Z419 Encounter for procedure for purposes other than remedying health state, unspecified: Secondary | ICD-10-CM | POA: Diagnosis not present

## 2023-07-19 ENCOUNTER — Ambulatory Visit (INDEPENDENT_AMBULATORY_CARE_PROVIDER_SITE_OTHER): Admitting: Physician Assistant

## 2023-07-19 ENCOUNTER — Encounter: Payer: Self-pay | Admitting: Physician Assistant

## 2023-07-19 VITALS — BP 110/68 | HR 71 | Temp 97.9°F | Ht 66.0 in | Wt 229.5 lb

## 2023-07-19 DIAGNOSIS — F419 Anxiety disorder, unspecified: Secondary | ICD-10-CM

## 2023-07-19 DIAGNOSIS — Z1211 Encounter for screening for malignant neoplasm of colon: Secondary | ICD-10-CM | POA: Diagnosis not present

## 2023-07-19 DIAGNOSIS — E669 Obesity, unspecified: Secondary | ICD-10-CM | POA: Diagnosis not present

## 2023-07-19 DIAGNOSIS — F32A Depression, unspecified: Secondary | ICD-10-CM | POA: Diagnosis not present

## 2023-07-19 MED ORDER — WEGOVY 1 MG/0.5ML ~~LOC~~ SOAJ: 1.0000 mg | SUBCUTANEOUS | 0 refills | Status: DC

## 2023-07-19 NOTE — Patient Instructions (Signed)
 It was great to see you!  Keep up the good work with your weight loss  I will refer you to gastroenterology for colonoscopy and psychology for counseling  Let's follow-up in 3 months for Comprehensive Physical Exam (CPE) preventive care annual visit, sooner if you have concerns.  Take care,  Lucie Buttner PA-C

## 2023-07-19 NOTE — Progress Notes (Signed)
 Monica Washington is a 43 y.o. female here for a follow up of a pre-existing problem.  History of Present Illness:   Chief Complaint  Patient presents with   Weight Management Screening    Pt here for f/u on Wegovy  currently taking 1 mg, tolerating well.    HPI  Obesity Pt is on Wegovy  1 mg weekly. Good compliance and tolerance. Pt is on down 11 lb to 229 lb today 07/19/2023 from 241 lb 8 oz her last visit on 04/15/2023. Her highest weight was 247 lb and has lost 7.2% body weight since starting treatment. Pt notes her weight dipped to 226 lb after a day of continuous bowel movements. She attributes this episode to increasing Wegovy  to 1 mg and would like to maintain this dose for now. She reports she has stopped exercising recently due to the passing of her step mother.  Anxiety and Depression Currently wanting to work on seeing a therapist Declines need for medication at this time Denies suicidal ideation/hi Would like to work on Pharmacologist  Past Medical History:  Diagnosis Date   Acute back pain with radiculopathy 11/27/2021   Back pain    Palpitations    PVC (premature ventricular contraction)      Social History   Tobacco Use   Smoking status: Former    Current packs/day: 0.00    Types: Cigarettes    Quit date: 01/19/2013    Years since quitting: 10.5   Smokeless tobacco: Never  Vaping Use   Vaping status: Some Days  Substance Use Topics   Alcohol use: Not Currently    Comment: cannot tolerate   Drug use: Never    No past surgical history on file.  Family History  Problem Relation Age of Onset   Healthy Mother    Diabetes Father    Breast cancer Maternal Aunt        18s   Colon cancer Neg Hx     No Known Allergies  Current Medications:   Current Outpatient Medications:    cyclobenzaprine  (FLEXERIL ) 10 MG tablet, Take 1 tablet (10 mg total) by mouth 3 (three) times daily as needed for muscle spasms., Disp: 30 tablet, Rfl: 0   ibuprofen (ADVIL) 200  MG tablet, Take 800 mg by mouth every 6 (six) hours as needed., Disp: , Rfl:    Semaglutide -Weight Management (WEGOVY ) 1 MG/0.5ML SOAJ, Inject 1 mg into the skin once a week., Disp: 6 mL, Rfl: 0   Review of Systems:   Negative unless otherwise specified per HPI.  Vitals:   Vitals:   07/19/23 0944  BP: 110/68  Pulse: 71  Temp: 97.9 F (36.6 C)  TempSrc: Temporal  SpO2: 99%  Weight: 229 lb 8 oz (104.1 kg)  Height: 5' 6 (1.676 m)     Body mass index is 37.04 kg/m.  Physical Exam:   Physical Exam Vitals and nursing note reviewed.  Constitutional:      General: She is not in acute distress.    Appearance: She is well-developed. She is not ill-appearing or toxic-appearing.   Cardiovascular:     Rate and Rhythm: Normal rate and regular rhythm.     Pulses: Normal pulses.     Heart sounds: Normal heart sounds, S1 normal and S2 normal.  Pulmonary:     Effort: Pulmonary effort is normal.     Breath sounds: Normal breath sounds.   Skin:    General: Skin is warm and dry.   Neurological:  Mental Status: She is alert.     GCS: GCS eye subscore is 4. GCS verbal subscore is 5. GCS motor subscore is 6.   Psychiatric:        Speech: Speech normal.        Behavior: Behavior normal. Behavior is cooperative.     Assessment and Plan:   1. Obesity, unspecified class, unspecified obesity type, unspecified whether serious comorbidity present (Primary)  Continue efforts at healthy lifestyle Encouraged her to get back into her regular exercise Refill Wegovy  1 mg weekly Follow up in 3 month(s) for Comprehensive Physical Exam (CPE) preventive care annual visit, sooner if concerns  2. Special screening for malignant neoplasms, colon - Ambulatory referral to Gastroenterology  Referral to gastroenterology for further evaluation  3. Anxiety and depression - Ambulatory referral to Psychology   Denies suicidal ideation/hi Referral to psychology per patient request Continue to  monitor at each follow up   I, Lavern Simmers, acting as a Neurosurgeon for [employer], [place]., have documented all relevant documentation on the behalf of Monica Washington, [place], as directed by Monica Buttner, PA while in the presence of Monica Washington, [place].  I, Monica Washington, [place], have reviewed all documentation for this visit. The documentation on 07/19/23 for the exam, diagnosis, procedures, and orders are all accurate and complete.  Monica Buttner, PA-C

## 2023-07-27 ENCOUNTER — Telehealth: Payer: Self-pay

## 2023-07-27 ENCOUNTER — Other Ambulatory Visit (HOSPITAL_COMMUNITY): Payer: Self-pay

## 2023-07-27 NOTE — Telephone Encounter (Signed)
 Pharmacy Patient Advocate Encounter   Received notification from Onbase that prior authorization for WEGOVY  1MG /0.5ML is required/requested.   Insurance verification completed.   The patient is insured through [facility] [place] .   Per test claim: The current 28 day co-pay is, $4.00.  No PA needed at this time. This test claim was processed through [facility]- copay amounts may vary at other pharmacies due to pharmacy/plan contracts, or as the patient moves through the different stages of their insurance plan.     - Called and informed pharmacy ( only covers 28 days supply at a time. )

## 2023-07-31 DIAGNOSIS — Z419 Encounter for procedure for purposes other than remedying health state, unspecified: Secondary | ICD-10-CM | POA: Diagnosis not present

## 2023-08-20 ENCOUNTER — Ambulatory Visit

## 2023-08-20 ENCOUNTER — Ambulatory Visit (INDEPENDENT_AMBULATORY_CARE_PROVIDER_SITE_OTHER): Admitting: Physician Assistant

## 2023-08-20 ENCOUNTER — Encounter: Payer: Self-pay | Admitting: Physician Assistant

## 2023-08-20 VITALS — BP 100/60 | HR 80 | Temp 97.9°F | Ht 66.0 in | Wt 223.5 lb

## 2023-08-20 DIAGNOSIS — Z6836 Body mass index (BMI) 36.0-36.9, adult: Secondary | ICD-10-CM

## 2023-08-20 DIAGNOSIS — R1030 Lower abdominal pain, unspecified: Secondary | ICD-10-CM

## 2023-08-20 DIAGNOSIS — R140 Abdominal distension (gaseous): Secondary | ICD-10-CM | POA: Diagnosis not present

## 2023-08-20 DIAGNOSIS — R109 Unspecified abdominal pain: Secondary | ICD-10-CM | POA: Diagnosis not present

## 2023-08-20 DIAGNOSIS — E669 Obesity, unspecified: Secondary | ICD-10-CM | POA: Diagnosis not present

## 2023-08-20 DIAGNOSIS — I878 Other specified disorders of veins: Secondary | ICD-10-CM | POA: Diagnosis not present

## 2023-08-20 DIAGNOSIS — Z1211 Encounter for screening for malignant neoplasm of colon: Secondary | ICD-10-CM

## 2023-08-20 NOTE — Progress Notes (Signed)
 Monica Washington is a 43 y.o. female here for a follow up of a pre-existing problem.  History of Present Illness:   Chief Complaint  Patient presents with   Abdominal Pain    Pt c/o abdominal pain, bloating, very gassy, burping, worse the past month. Pt has been having diarrhea in the morning.    Discussed the use of AI scribe software for clinical note transcription with the patient, who gave verbal consent to proceed.  History of Present Illness Monica Washington is a 43 year old female who presents with severe gas pain and diarrhea.  Abdominal pain and bloating - Severe gas pain and bloating, particularly at night - Gas characterized by 'eggy' or sulfur-like burps - Current episode is more painful and persistent than prior episodes of gas issues - Bloating and gurgling persist after bowel movements - Significant discomfort despite attempted remedies  Diarrhea - Watery diarrhea every morning since approximately July 19, 2023 - Some relief of symptoms after bowel movements - No blood in stool  Dietary factors - No known issues with dairy or lactose intolerance - Regular cheese consumption - Recent increased intake of watermelon and pineapple, uncertain if contributing to symptoms  Impact on daily functioning - Symptoms have caused missed work and disrupted sleep - Ongoing pain and discomfort leading to frustration and irritability  Prior treatments for constipation - Use of tea and a TikTok-purchased 15-day cleanse pill occasionally to relieve constipation - Persistent symptoms despite these interventions    Past Medical History:  Diagnosis Date   Acute back pain with radiculopathy 11/27/2021   PVC (premature ventricular contraction)      Social History   Tobacco Use   Smoking status: Former    Current packs/day: 0.00    Types: Cigarettes    Quit date: 01/19/2013    Years since quitting: 10.5   Smokeless tobacco: Never  Vaping Use   Vaping status: Some Days   Substance Use Topics   Alcohol use: Not Currently    Comment: cannot tolerate   Drug use: Never    No past surgical history on file.  Family History  Problem Relation Age of Onset   Healthy Mother    Diabetes Father    Prostate cancer Father    Breast cancer Maternal Aunt        8s   Colon cancer Neg Hx     No Known Allergies  Current Medications:   Current Outpatient Medications:    cyclobenzaprine  (FLEXERIL ) 10 MG tablet, Take 1 tablet (10 mg total) by mouth 3 (three) times daily as needed for muscle spasms., Disp: 30 tablet, Rfl: 0   ibuprofen (ADVIL) 200 MG tablet, Take 800 mg by mouth every 6 (six) hours as needed., Disp: , Rfl:    Semaglutide -Weight Management (WEGOVY ) 1 MG/0.5ML SOAJ, Inject 1 mg into the skin once a week., Disp: 6 mL, Rfl: 0   Review of Systems:   Negative unless otherwise specified per HPI.  Vitals:   Vitals:   08/20/23 0954  BP: 100/60  Pulse: 80  Temp: 97.9 F (36.6 C)  TempSrc: Temporal  SpO2: 96%  Weight: 223 lb 8 oz (101.4 kg)  Height: 5' 6 (1.676 m)     Body mass index is 36.07 kg/m.  Physical Exam:   Physical Exam Vitals and nursing note reviewed.  Constitutional:      General: She is not in acute distress.    Appearance: She is well-developed. She is not ill-appearing or toxic-appearing.  Cardiovascular:     Rate and Rhythm: Normal rate and regular rhythm.     Pulses: Normal pulses.     Heart sounds: Normal heart sounds, S1 normal and S2 normal.  Pulmonary:     Effort: Pulmonary effort is normal.     Breath sounds: Normal breath sounds.  Abdominal:     General: Abdomen is flat. Bowel sounds are normal.     Palpations: Abdomen is soft.     Tenderness: There is generalized abdominal tenderness.  Skin:    General: Skin is warm and dry.  Neurological:     Mental Status: She is alert.     GCS: GCS eye subscore is 4. GCS verbal subscore is 5. GCS motor subscore is 6.  Psychiatric:        Speech: Speech normal.         Behavior: Behavior normal. Behavior is cooperative.     Assessment and Plan:    Assessment & Plan Chronic abdominal pain with bloating, gas, and altered bowel habits Chronic abdominal pain with bloating, gas, sulfur burps, and diarrhea since June 30. Symptoms predate Wegovy  use. Differential includes dietary triggers. No blood in stools or gallbladder issues. No family history of colon cancer, but colonoscopy indicated due to age. - Order abdominal x-ray to assess for retained stool. - Refer to gastroenterology for colonoscopy. - Provide handout on dietary fiber options. - Discuss potential use of probiotics. - Advise to monitor dietary triggers and consider reducing intake of foods that may irritate the stomach.  Obesity Obesity managed with Semaglutide  (Wegovy ) 1 MG weekly. She lost 17 pounds since May and is satisfied with the current dose. - Continue Semaglutide  (Wegovy ) 1 MG subcutaneous weekly. -Low threshold to stop this if symptom(s) worsen     Lucie Buttner, PA-C

## 2023-08-24 ENCOUNTER — Encounter: Payer: Self-pay | Admitting: Gastroenterology

## 2023-08-30 ENCOUNTER — Ambulatory Visit: Payer: Self-pay | Admitting: Physician Assistant

## 2023-08-31 ENCOUNTER — Other Ambulatory Visit: Payer: Self-pay | Admitting: Physician Assistant

## 2023-08-31 DIAGNOSIS — Z419 Encounter for procedure for purposes other than remedying health state, unspecified: Secondary | ICD-10-CM | POA: Diagnosis not present

## 2023-08-31 DIAGNOSIS — R1030 Lower abdominal pain, unspecified: Secondary | ICD-10-CM

## 2023-08-31 NOTE — Telephone Encounter (Signed)
 Patient is agreeable with recommendations, please advise on orders to be placed

## 2023-09-02 ENCOUNTER — Encounter: Payer: Self-pay | Admitting: Physician Assistant

## 2023-09-03 ENCOUNTER — Other Ambulatory Visit

## 2023-09-28 ENCOUNTER — Encounter: Payer: Self-pay | Admitting: Physician Assistant

## 2023-09-30 ENCOUNTER — Telehealth: Payer: Self-pay | Admitting: *Deleted

## 2023-09-30 NOTE — Telephone Encounter (Signed)
 Spoke to pt told her per Lucie Job Lucie,  CT scan was denied by her insurance. I think this is fine as long as she sees gastroenterology soon and they can complete her colonoscopy and we can go from there. Pt verbalized understanding and said she is scheduled for 9/15 for consultation and then they will schedule colonoscopy. Told pt okay. Pt said she is having pain. I asked her did you tell them you were having pain? Pt said no cause she was not at the time, just started again thinks it is something he ate. Told pt you can contact GI and ask to be put on cancellation list. Pt verbalized understanding.

## 2023-10-01 DIAGNOSIS — Z419 Encounter for procedure for purposes other than remedying health state, unspecified: Secondary | ICD-10-CM | POA: Diagnosis not present

## 2023-10-06 ENCOUNTER — Encounter: Payer: Self-pay | Admitting: Gastroenterology

## 2023-10-06 ENCOUNTER — Ambulatory Visit (INDEPENDENT_AMBULATORY_CARE_PROVIDER_SITE_OTHER): Admitting: Gastroenterology

## 2023-10-06 VITALS — BP 110/84 | HR 87 | Ht 65.5 in | Wt 218.1 lb

## 2023-10-06 DIAGNOSIS — R1031 Right lower quadrant pain: Secondary | ICD-10-CM | POA: Diagnosis not present

## 2023-10-06 DIAGNOSIS — Z1211 Encounter for screening for malignant neoplasm of colon: Secondary | ICD-10-CM | POA: Diagnosis not present

## 2023-10-06 DIAGNOSIS — R140 Abdominal distension (gaseous): Secondary | ICD-10-CM

## 2023-10-06 DIAGNOSIS — R194 Change in bowel habit: Secondary | ICD-10-CM | POA: Diagnosis not present

## 2023-10-06 DIAGNOSIS — R143 Flatulence: Secondary | ICD-10-CM | POA: Diagnosis not present

## 2023-10-06 MED ORDER — NA SULFATE-K SULFATE-MG SULF 17.5-3.13-1.6 GM/177ML PO SOLN
1.0000 | Freq: Once | ORAL | 0 refills | Status: AC
Start: 2023-10-06 — End: 2023-10-06

## 2023-10-06 NOTE — Patient Instructions (Addendum)
 Constipation, Gas, and bloat Recommend Low fodmap diet Over the counter samples of IBgard 2 capsule with meals OTC miralax po daily for constipation   We have sent the following medications to your pharmacy for you to pick up at your convenience: SUPREP  You have been scheduled for a colonoscopy. Please follow written instructions given to you at your visit today.   If you use inhalers (even only as needed), please bring them with you on the day of your procedure.  DO NOT TAKE 7 DAYS PRIOR TO TEST- Trulicity (dulaglutide) Ozempic, Wegovy  (semaglutide ) Mounjaro (tirzepatide) Bydureon Bcise (exanatide extended release)  DO NOT TAKE 1 DAY PRIOR TO YOUR TEST Rybelsus (semaglutide ) Adlyxin (lixisenatide) Victoza (liraglutide) Byetta (exanatide) ___________________________________________________________________________  Due to recent changes in healthcare laws, you may see the results of your imaging and laboratory studies on MyChart before your provider has had a chance to review them.  We understand that in some cases there may be results that are confusing or concerning to you. Not all laboratory results come back in the same time frame and the provider may be waiting for multiple results in order to interpret others.  Please give us  48 hours in order for your provider to thoroughly review all the results before contacting the office for clarification of your results.   _______________________________________________________  If your blood pressure at your visit was 140/90 or greater, please contact your primary care physician to follow up on this.  _______________________________________________________  If you are age 45 or older, your body mass index should be between 23-30. Your Body mass index is 35.75 kg/m. If this is out of the aforementioned range listed, please consider follow up with your Primary Care Provider.  If you are age 76 or younger, your body mass index should be  between 19-25. Your Body mass index is 35.75 kg/m. If this is out of the aformentioned range listed, please consider follow up with your Primary Care Provider.   ________________________________________________________  The [facility] providers would like to encourage you to use MYCHART to communicate with providers for non-urgent requests or questions.  Due to long hold times on the telephone, sending your provider a message by [facility] may be a faster and more efficient way to get a response.  Please allow 48 business hours for a response.  Please remember that this is for non-urgent requests.  _______________________________________________________  [facility] is using a team-based approach to care.  Your team is made up of your doctor and two to three APPS. Our APPS (Nurse Practitioners and Physician Assistants) work with your physician to ensure care continuity for you. They are fully qualified to address your health concerns and develop a treatment plan. They communicate directly with your gastroenterologist to care for you. Seeing the Advanced Practice Practitioners on your physician's team can help you by facilitating care more promptly, often allowing for earlier appointments, access to diagnostic testing, procedures, and other specialty referrals.   Thank you for trusting me with your gastrointestinal care. Deanna May, FNP-C

## 2023-10-06 NOTE — Progress Notes (Addendum)
 Chief Complaint:colon screening, bloating, gas Primary GI Doctor: Dr. Suzann   HPI:  Patient is a  43  year old female patient with no significant past medical history, who was referred to me by Monica Lukes, PA on 06/3023 for a evaluation of colon screening .    Interval History     Patient presents for evaluation of abdominal pain, gas, and bloat.  She has 1-2 bowel movements per day most of the time with occasional constipation. She reports last 2 months she has had more issues with constipation. No blood in stool. She has intermittent RLQ pain. The pain can be brought on by certain things such as the cappuccino she has this morning.  No current NSAID use.   She recently started Wegovy  about 3 months.   Former smoker. No alcohol use.   Patient has never had colonoscopy or EGD.  Patient's family history: no family history of Colon CA, no history of esophageal CA  Wt Readings from Last 3 Encounters:  10/06/23 218 lb 2 oz (98.9 kg)  08/20/23 223 lb 8 oz (101.4 kg)  07/19/23 229 lb 8 oz (104.1 kg)    Past Medical History:  Diagnosis Date   Acute back pain with radiculopathy 11/27/2021   PVC (premature ventricular contraction)     Past Surgical History:  Procedure Laterality Date   NO PAST SURGERIES      Current Outpatient Medications  Medication Sig Dispense Refill   cyclobenzaprine  (FLEXERIL ) 10 MG tablet Take 1 tablet (10 mg total) by mouth 3 (three) times daily as needed for muscle spasms. 30 tablet 0   ibuprofen (ADVIL) 200 MG tablet Take 800 mg by mouth every 6 (six) hours as needed.     Semaglutide -Weight Management (WEGOVY ) 1 MG/0.5ML SOAJ Inject 1 mg into the skin once a week. 6 mL 0   No current facility-administered medications for this visit.    Allergies as of 10/06/2023   (No Known Allergies)    Family History  Problem Relation Age of Onset   Healthy Mother    Diabetes Father    Prostate cancer Father    Diabetes Sister        most of the  girls are borderline diabetes   Alzheimer's disease Maternal Grandmother    Breast cancer Maternal Aunt        59s   Sickle cell trait Daughter        fathers side   Colon cancer Neg Hx     Review of Systems:    Constitutional: No weight loss, fever, chills, weakness or fatigue HEENT: Eyes: No change in vision               Ears, Nose, Throat:  No change in hearing or congestion Skin: No rash or itching Cardiovascular: No chest pain, chest pressure or palpitations   Respiratory: No SOB or cough Gastrointestinal: See HPI and otherwise negative Genitourinary: No dysuria or change in urinary frequency Neurological: No headache, dizziness or syncope Musculoskeletal: No new muscle or joint pain Hematologic: No bleeding or bruising Psychiatric: No history of depression or anxiety    Physical Exam:  Vital signs: BP 110/84 (BP Location: Left Arm, Patient Position: Sitting, Cuff Size: Large)   Pulse 87   Ht 5' 5.5 (1.664 m) Comment: height measured without shoes  Wt 218 lb 2 oz (98.9 kg)   LMP 09/20/2023   BMI 35.75 kg/m   Constitutional:   Pleasant  female appears to be in NAD, Well  developed, Well nourished, alert and cooperative Throat: Oral cavity and pharynx without inflammation, swelling or lesion.  Respiratory: Respirations even and unlabored. Lungs clear to auscultation bilaterally.   No wheezes, crackles, or rhonchi.  Cardiovascular: Normal S1, S2. Regular rate and rhythm. No peripheral edema, cyanosis or pallor.  Gastrointestinal:  Soft, nondistended, nontender. No rebound or guarding. Hypoactive bowel sounds. No appreciable masses or hepatomegaly. Rectal:  Not performed.  Msk:  Symmetrical without gross deformities. Without edema, no deformity or joint abnormality.  Neurologic:  Alert and  oriented x4;  grossly normal neurologically.  Skin:   Dry and intact without significant lesions or rashes.  RELEVANT LABS AND IMAGING: CBC    Latest Ref Rng & Units 03/24/2022    10:35 AM 07/10/2021   10:18 AM 01/16/2020   10:20 AM  CBC  WBC 4.0 - 10.5 K/uL 7.9  8.7  8.5   Hemoglobin 12.0 - 15.0 g/dL 87.6  87.2  87.2   Hematocrit 36.0 - 46.0 % 38.7  39.5  39.0   Platelets 150 - 400 K/uL 281  300.0  307.0      CMP     Latest Ref Rng & Units 08/04/2022    2:58 PM 03/26/2022    9:42 AM 03/24/2022   10:35 AM  CMP  Glucose 70 - 99 mg/dL 92  91  884   BUN 6 - 23 mg/dL 12  9  10    Creatinine 0.40 - 1.20 mg/dL 9.07  9.08  9.08   Sodium 135 - 145 mEq/L 136  137  138   Potassium 3.5 - 5.1 mEq/L 3.9  4.4  3.3   Chloride 96 - 112 mEq/L 100  102  104   CO2 19 - 32 mEq/L 27  29  28    Calcium 8.4 - 10.5 mg/dL 9.2  9.3  8.5   Total Protein 6.0 - 8.3 g/dL 7.7  7.1    Total Bilirubin 0.2 - 1.2 mg/dL 0.4  0.6    Alkaline Phos 39 - 117 U/L 47  47    AST 0 - 37 U/L 19  15    ALT 0 - 35 U/L 15  14       Lab Results  Component Value Date   TSH 1.099 03/24/2022   08/20/23 abdominal xray -  IMPRESSION: Nonspecific bowel gas pattern with increased air throughout small and large bowel within the central and upper abdomen, nonspecific. Paucity of bowel gas in the pelvis. Consider further evaluation with abdominopelvic CT.  Assessment: Encounter Diagnoses  Name Primary?   Flatulence Yes   Bloating    Altered bowel habits    Special screening for malignant neoplasms, colon    RLQ abdominal pain      43 year old female patient with worsening constipation, abd pain, and bloating over the last 2 months.  Patient did recently start Wegovy .  Will go ahead and have patient start daily MiraLAX and recommend low FODMAP diet for the gas and bloat.  Provided samples of IBgard.  Abdominal x-ray in August showed increased air throughout small and large bowel with recommendations of CT scan but unfortunately was denied by insurance.  Patient due for colon screening surveillance will go ahead and schedule in [facility] with Dr. Suzann.    Plan: -Recommend low fodmap diet  -samples of IBgard  with meals -OTC miralax po daily  -CTAP was denied by insurance -Schedule for a colonoscopy in [facility] with Dr. Suzann . The risks and benefits of  colonoscopy with possible polypectomy / biopsies were discussed and the patient agrees to proceed.  - Stop Wegovy  1 week prior     Thank you for the courtesy of this consult. Please call me with any questions or concerns.   Monica Mctigue, FNP-C [facility] 10/06/2023, 11:06 AM  Cc: Monica Washington, [place]  I have reviewed the clinic note as outlined by Cathryne Beal, NP and agree with the assessment, plan and medical decision making.  Monica Washington presents to the office for colorectal cancer screening as well as incidental symptoms of constipation, abdominal pain and bloating.  No family history of colorectal cancer.  She had an abdominal plain film performed over the summer that showed increased gas throughout the large and small bowel but no other definitive abnormalities.  Per APP note, CT scan was not covered by insurance.  I agree with low FODMAP diet and conservative measures for managing gas and bloating.  Will proceed with screening colonoscopy.  Inocente Hausen, MD

## 2023-10-11 ENCOUNTER — Ambulatory Visit (INDEPENDENT_AMBULATORY_CARE_PROVIDER_SITE_OTHER): Admitting: Clinical

## 2023-10-11 ENCOUNTER — Encounter (HOSPITAL_COMMUNITY): Payer: Self-pay

## 2023-10-11 DIAGNOSIS — F411 Generalized anxiety disorder: Secondary | ICD-10-CM | POA: Diagnosis not present

## 2023-10-11 DIAGNOSIS — F4310 Post-traumatic stress disorder, unspecified: Secondary | ICD-10-CM
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
## 2023-10-20 ENCOUNTER — Encounter: Payer: Self-pay | Admitting: Physician Assistant

## 2023-10-20 ENCOUNTER — Other Ambulatory Visit (HOSPITAL_COMMUNITY): Payer: Self-pay

## 2023-10-20 ENCOUNTER — Telehealth: Payer: Self-pay

## 2023-10-20 ENCOUNTER — Ambulatory Visit (INDEPENDENT_AMBULATORY_CARE_PROVIDER_SITE_OTHER): Admitting: Physician Assistant

## 2023-10-20 ENCOUNTER — Telehealth: Payer: Self-pay | Admitting: *Deleted

## 2023-10-20 VITALS — BP 118/80 | HR 78 | Temp 97.3°F | Ht 65.5 in | Wt 215.5 lb

## 2023-10-20 DIAGNOSIS — F419 Anxiety disorder, unspecified: Secondary | ICD-10-CM | POA: Diagnosis not present

## 2023-10-20 DIAGNOSIS — F32A Depression, unspecified: Secondary | ICD-10-CM

## 2023-10-20 DIAGNOSIS — E669 Obesity, unspecified: Secondary | ICD-10-CM | POA: Diagnosis not present

## 2023-10-20 DIAGNOSIS — R7303 Prediabetes: Secondary | ICD-10-CM | POA: Diagnosis not present

## 2023-10-20 DIAGNOSIS — E785 Hyperlipidemia, unspecified: Secondary | ICD-10-CM | POA: Diagnosis not present

## 2023-10-20 DIAGNOSIS — Z0000 Encounter for general adult medical examination without abnormal findings: Secondary | ICD-10-CM | POA: Diagnosis not present

## 2023-10-20 LAB — CBC WITH DIFFERENTIAL/PLATELET
Basophils Absolute: 0 K/uL (ref 0.0–0.1)
Basophils Relative: 0.6 % (ref 0.0–3.0)
Eosinophils Absolute: 0.1 K/uL (ref 0.0–0.7)
Eosinophils Relative: 2 % (ref 0.0–5.0)
HCT: 39.6 % (ref 36.0–46.0)
Hemoglobin: 13 g/dL (ref 12.0–15.0)
Lymphocytes Relative: 24 % (ref 12.0–46.0)
Lymphs Abs: 1.7 K/uL (ref 0.7–4.0)
MCHC: 32.9 g/dL (ref 30.0–36.0)
MCV: 86.1 fl (ref 78.0–100.0)
Monocytes Absolute: 0.5 K/uL (ref 0.1–1.0)
Monocytes Relative: 7.3 % (ref 3.0–12.0)
Neutro Abs: 4.6 K/uL (ref 1.4–7.7)
Neutrophils Relative %: 66.1 % (ref 43.0–77.0)
Platelets: 258 K/uL (ref 150.0–400.0)
RBC: 4.6 Mil/uL (ref 3.87–5.11)
RDW: 14.9 % (ref 11.5–15.5)
WBC: 7 K/uL (ref 4.0–10.5)

## 2023-10-20 LAB — HEMOGLOBIN A1C: Hgb A1c MFr Bld: 5.9 % (ref 4.6–6.5)

## 2023-10-20 MED ORDER — WEGOVY 1.7 MG/0.75ML ~~LOC~~ SOAJ: 1.7000 mg | SUBCUTANEOUS | 1 refills | Status: DC

## 2023-10-20 NOTE — Telephone Encounter (Signed)
 Please do PA for Wegovy  1.7 mg.

## 2023-10-20 NOTE — Progress Notes (Signed)
 Subjective:    Melaya Hoselton Cuppett is a 43 y.o. female and is here for a comprehensive physical exam.  HPI  There are no preventive care reminders to display for this patient.  Discussed the use of AI scribe software for clinical note transcription with the patient, who gave verbal consent to proceed.  History of Present Illness   Lashena Signer Burston is a 43 year old female who presents for medication management and follow-up.  She has been using Wegovy  for weight management, but her Medicaid coverage has stopped covering it. She last took 1 mg Wegovy  on the previous "Sunday. She is concerned about potential weight gain if she cannot continue the medication.  She has prediabetes and has previously been prescribed metformin, which caused gastrointestinal side effects, specifically diarrhea, with the immediate-release formulation.  She experiences difficulty with sleep, partly due to sharing her bed with her seven-year-old child and a busy mind. No sleep apnea is reported.  She maintains a healthy diet most of the time but occasionally experiences gastrointestinal discomfort from certain foods. She does not consume alcohol regularly and does not smoke. 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        Health Maintenance: Immunizations -- declined flu shot Colonoscopy -- scheduled for next month Mammogram -- UpToDate  PAP -- UpToDate  Bone Density -- N/A  Diet -- eating an overall healthy diet Exercise -- walking on treadmill   Sleep habits -- not good sleep Mood -- situational depression/anxiety; reports there is no suicidal ideation/hi   UTD with dentist? - yes UTD with eye doctor? - no  Weight history: Wt Readings from Last 10 Encounters:  10/20/23 215 lb 8 oz (97.8 kg)  10/06/23 218 lb 2 oz (98.9 kg)  08/20/23 223 lb 8 oz (101.4 kg)  07/19/23 229 lb 8 oz (104.1 kg)  05/31/23 240 lb (108.9 kg)  04/19/23 243 lb (110.2  kg)  04/15/23 241 lb 8 oz (109.5 kg)  02/23/23 240 lb (108.9 kg)  08/04/22 221 lb 6.1 oz (100.4 kg)  05/21/22 215 lb 12.8 oz (97.9 kg)   Body mass index is 35.32 kg/m. Patient's last menstrual period was 10/15/2023 (exact date).  Alcohol use:  reports that she does not currently use alcohol.  Tobacco use:  Tobacco Use: Medium Risk (10/20/2023)   Patient History    Smoking Tobacco Use: Former    Smokeless Tobacco Use: Never    Passive Exposure: Not on file   Eligible for lung cancer screening? no     10" /01/2023    9:06 AM  Depression screen PHQ 2/9  Decreased Interest 1  Down, Depressed, Hopeless 1  PHQ - 2 Score 2  Altered sleeping 1  Tired, decreased energy 1  Change in appetite 0  Feeling bad or failure about yourself  0  Trouble concentrating 0  Moving slowly or fidgety/restless 0  Suicidal thoughts 0  PHQ-9 Score 4  Difficult doing work/chores Not difficult at all     Other providers/specialists: Patient Care Team: Job Lukes, [place] as PCP - General (Physician Assistant)    PMHx, SurgHx, SocialHx, Medications, and Allergies were reviewed in the Visit Navigator and updated as appropriate.   Past Medical History:  Diagnosis Date   Acute back pain with radiculopathy 11/27/2021   PVC (premature ventricular contraction)      Past Surgical History:  Procedure Laterality Date   NO PAST SURGERIES  Family History  Problem Relation Age of Onset   Healthy Mother    Diabetes Father    Prostate cancer Father    Diabetes Sister        most of the girls are borderline diabetes   Alzheimer's disease Maternal Grandmother    Breast cancer Maternal Aunt        23s   Sickle cell trait Daughter        fathers side   Colon cancer Neg Hx     Social History   Tobacco Use   Smoking status: Former    Current packs/day: 0.00    Types: Cigarettes    Quit date: 01/19/2013    Years since quitting: 10.7   Smokeless tobacco: Never  Vaping Use   Vaping  status: Former  Substance Use Topics   Alcohol use: Not Currently    Comment: cannot tolerate   Drug use: Never    Review of Systems:   Review of Systems  Constitutional:  Negative for chills, fever, malaise/fatigue and weight loss.  HENT:  Negative for hearing loss, sinus pain and sore throat.   Respiratory:  Negative for cough and hemoptysis.   Cardiovascular:  Negative for chest pain, palpitations, leg swelling and PND.  Gastrointestinal:  Negative for abdominal pain, constipation, diarrhea, heartburn, nausea and vomiting.  Genitourinary:  Negative for dysuria, frequency and urgency.  Musculoskeletal:  Negative for back pain, myalgias and neck pain.  Skin:  Negative for itching and rash.  Neurological:  Negative for dizziness, tingling, seizures and headaches.  Endo/Heme/Allergies:  Negative for polydipsia.  Psychiatric/Behavioral:  Negative for depression. The patient is not nervous/anxious.     Objective:   BP 118/80 (BP Location: Left Arm, Patient Position: Sitting, Cuff Size: Large)   Pulse 78   Temp (!) 97.3 F (36.3 C) (Temporal)   Ht 5' 5.5 (1.664 m)   Wt 215 lb 8 oz (97.8 kg)   LMP 10/15/2023 (Exact Date)   SpO2 98%   BMI 35.32 kg/m  Body mass index is 35.32 kg/m.   General Appearance:    Alert, cooperative, no distress, appears stated age  Head:    Normocephalic, without obvious abnormality, atraumatic  Eyes:    PERRL, conjunctiva/corneas clear, EOM's intact, fundi    benign, both eyes  Ears:    Normal TM's and external ear canals, both ears  Nose:   Nares normal, septum midline, mucosa normal, no drainage    or sinus tenderness  Throat:   Lips, mucosa, and tongue normal; teeth and gums normal  Neck:   Supple, symmetrical, trachea midline, no adenopathy;    thyroid :  no enlargement/tenderness/nodules; no carotid   bruit or JVD  Back:     Symmetric, no curvature, ROM normal, no CVA tenderness  Lungs:     Clear to auscultation bilaterally, respirations  unlabored  Chest Wall:    No tenderness or deformity   Heart:    Regular rate and rhythm, S1 and S2 normal, no murmur, rub or gallop  Breast Exam:    Deferred  Abdomen:     Soft, non-tender, bowel sounds active all four quadrants,    no masses, no organomegaly  Genitalia:    Deferred   Extremities:   Extremities normal, atraumatic, no cyanosis or edema  Pulses:   2+ and symmetric all extremities  Skin:   Skin color, texture, turgor normal, no rashes or lesions  Lymph nodes:   Cervical, supraclavicular, and axillary nodes normal  Neurologic:  CNII-XII intact, normal strength, sensation and reflexes    throughout    Assessment/Plan:   Assessment and Plan    Adult Wellness Visit Routine visit with no new family history updates. Reports difficulty sleeping due to sharing bed with child. - Perform blood work. - Provide referral to [facility] for therapy. - Encourage continued healthy eating habits.   Obesity Obesity management complicated by Medicaid coverage changes for Wegovy . Concern about weight gain if Wegovy  is discontinued. - Resubmit Wegovy  prescription at 1.7 mg dose. - Discuss potential for cosmetic surgery after weight stabilization.  Prediabetes Prediabetes with previous gastrointestinal side effects from immediate-release metformin . - Consider extended-release metformin  after colonoscopy. - Monitor A1c and other relevant labs.  Hyperlipidemia Hyperlipidemia with potential benefit from weight management on lipid levels.  Anxiety and depression Dissatisfaction with current therapy. Prefers therapist outside community for personal reasons. - Provide referral to [facility] for therapy. - Encourage exploration of new therapy options.            Lucie Buttner, PA-C [place] [facility]

## 2023-10-20 NOTE — Patient Instructions (Signed)
 It was great to see you!  I will place referral for [facility]      Take care,  Sueo Cullen PA-C

## 2023-10-20 NOTE — Telephone Encounter (Signed)
 Pharmacy Patient Advocate Encounter  Effective October 1st, [place] will discontinue coverage of GLP1 medications for weight loss (such as Wegovy and Zepbound ), unless the patient has a documented history of a heart attack or stroke. Zepbound  will continue to be covered only for patients with moderate to severe sleep apnea (AHI 15-30). Because of this change, the prior authorization team will not be submitting new PA requests for GLP1 medications prescribed for weight loss between now and October 1st, as patients will be unable to continue therapy under Medicaid coverage.

## 2023-10-20 NOTE — Telephone Encounter (Signed)
 Noted

## 2023-10-21 ENCOUNTER — Encounter: Payer: Self-pay | Admitting: Physician Assistant

## 2023-10-21 ENCOUNTER — Ambulatory Visit: Payer: Self-pay | Admitting: Physician Assistant

## 2023-10-21 LAB — LIPID PANEL
Cholesterol: 156 mg/dL (ref 0–200)
HDL: 47.3 mg/dL (ref >39.00)
LDL Cholesterol: 99 mg/dL (ref 0–99)
NonHDL: 108.61
Total CHOL/HDL Ratio: 3
Triglycerides: 50 mg/dL (ref 0.0–149.0)
VLDL: 10 mg/dL (ref 0.0–40.0)

## 2023-10-21 LAB — COMPREHENSIVE METABOLIC PANEL WITH GFR
ALT: 11 U/L (ref 0–35)
AST: 16 U/L (ref 0–37)
Albumin: 3.9 g/dL (ref 3.5–5.2)
Alkaline Phosphatase: 38 U/L — ABNORMAL LOW (ref 39–117)
BUN: 8 mg/dL (ref 6–23)
CO2: 27 meq/L (ref 19–32)
Calcium: 8.9 mg/dL (ref 8.4–10.5)
Chloride: 104 meq/L (ref 96–112)
Creatinine, Ser: 0.85 mg/dL (ref 0.40–1.20)
GFR: 82.86 mL/min (ref >60.00)
Glucose, Bld: 87 mg/dL (ref 70–99)
Potassium: 3.5 meq/L (ref 3.5–5.1)
Sodium: 137 meq/L (ref 135–145)
Total Bilirubin: 0.7 mg/dL (ref 0.2–1.2)
Total Protein: 7.4 g/dL (ref 6.0–8.3)

## 2023-11-03 DIAGNOSIS — F4310 Post-traumatic stress disorder, unspecified: Secondary | ICD-10-CM | POA: Diagnosis not present

## 2023-11-06 ENCOUNTER — Encounter: Payer: Self-pay | Admitting: Physician Assistant

## 2023-11-10 ENCOUNTER — Ambulatory Visit: Payer: Self-pay | Admitting: Physician Assistant

## 2023-11-10 DIAGNOSIS — F4310 Post-traumatic stress disorder, unspecified: Secondary | ICD-10-CM | POA: Diagnosis not present

## 2023-11-10 NOTE — Telephone Encounter (Signed)
 FYI Only or Action Required?: Action required by provider: request for appointment.  Patient was last seen in primary care on 10/20/2023 by Job Lukes, PA.  Called Nurse Triage reporting Rectal Bleeding.  Symptoms began a week ago.  Interventions attempted: Nothing.  Symptoms are: stable.Has a hemorrhoid that started bleeding last week, minimal blood.  Triage Disposition: See PCP When Office is Open (Within 3 Days)  Patient/caregiver understands and will follow disposition?: Yes   Reason for Disposition  MILD rectal bleeding (e.g., more than just a few drops or streaks)  Answer Assessment - Initial Assessment Questions 1. APPEARANCE of BLOOD: What color is it? Is it passed separately, on the surface of the stool, or mixed in with the stool?      red 2. AMOUNT: How much blood was passed?      small 3. FREQUENCY: How many times has blood been passed with the stools?      2-3 4. ONSET: When was the blood first seen in the stools? (Days or weeks)      Last week 5. DIARRHEA: Is there also some diarrhea? If Yes, ask: How many diarrhea stools in the past 24 hours?      no 6. CONSTIPATION: Do you have constipation? If Yes, ask: How bad is it?     yes 7. RECURRENT SYMPTOMS: Have you had blood in your stools before? If Yes, ask: When was the last time? and What happened that time?      no 8. BLOOD THINNERS: Do you take any blood thinners? (e.g., aspirin, clopidogrel / Plavix, coumadin, heparin). Notes: Other strong blood thinners include: Arixtra (fondaparinux), Eliquis (apixaban), Pradaxa (dabigatran), and Xarelto (rivaroxaban).     no 9. OTHER SYMPTOMS: Do you have any other symptoms?  (e.g., abdomen pain, vomiting, dizziness, fever)     Rectal pain from hemorrhoid 10. PREGNANCY: Is there any chance you are pregnant? When was your last menstrual period?       no  Protocols used: Rectal Bleeding-A-AH

## 2023-11-10 NOTE — Telephone Encounter (Signed)
 Noted

## 2023-11-12 ENCOUNTER — Ambulatory Visit (INDEPENDENT_AMBULATORY_CARE_PROVIDER_SITE_OTHER): Admitting: Physician Assistant

## 2023-11-12 ENCOUNTER — Encounter: Payer: Self-pay | Admitting: Physician Assistant

## 2023-11-12 VITALS — BP 110/76 | HR 77 | Temp 97.5°F | Ht 65.5 in | Wt 213.5 lb

## 2023-11-12 DIAGNOSIS — L732 Hidradenitis suppurativa: Secondary | ICD-10-CM | POA: Diagnosis not present

## 2023-11-12 DIAGNOSIS — K644 Residual hemorrhoidal skin tags: Secondary | ICD-10-CM | POA: Diagnosis not present

## 2023-11-12 MED ORDER — HYDROCORTISONE ACETATE 25 MG RE SUPP: 25.0000 mg | Freq: Two times a day (BID) | RECTAL | 0 refills | Status: AC

## 2023-11-12 MED ORDER — CLINDAMYCIN PHOSPHATE 1 % EX SWAB: CUTANEOUS | 1 refills | Status: AC

## 2023-11-12 NOTE — Progress Notes (Signed)
 Monica Washington is a 43 y.o. female here for a follow up of a pre-existing problem.  History of Present Illness:   Chief Complaint  Patient presents with   Rectal Bleeding    Pt c/o bleeding last week, thinks Hemorrhoids.    Discussed the use of AI scribe software for clinical note transcription with the patient, who gave verbal consent to proceed.  History of Present Illness   Monica Washington is a 43 year old female who presents with rectal bleeding and hemorrhoids.  Rectal bleeding has been present for approximately six days, with blood spots on tissue paper after bowel movements. There is no dripping or filling of the toilet bowl. Bleeding is associated with straining during bowel movements. Hemorrhoids have been prolapsed and irritated for several months. She uses an over-the-counter cream for hemorrhoids and has Miralax at home to aid with bowel movements. She spends extended periods on the toilet. A colonoscopy is scheduled for November 6th.   She reports that she has history of boils. Had one in the pubic region a few days ago and this moved more in to her groin. Not painful. Getting smaller. Reports there is no sexual transmitted infection concerns.       Past Medical History:  Diagnosis Date   Acute back pain with radiculopathy 11/27/2021   PVC (premature ventricular contraction)      Social History   Tobacco Use   Smoking status: Former    Current packs/day: 0.00    Types: Cigarettes    Quit date: 01/19/2013    Years since quitting: 10.8   Smokeless tobacco: Never  Vaping Use   Vaping status: Former  Substance Use Topics   Alcohol use: Not Currently    Comment: cannot tolerate   Drug use: Never    Past Surgical History:  Procedure Laterality Date   NO PAST SURGERIES      Family History  Problem Relation Age of Onset   Healthy Mother    Diabetes Father    Prostate cancer Father    Diabetes Sister        most of the girls are borderline diabetes    Alzheimer's disease Maternal Grandmother    Breast cancer Maternal Aunt        47s   Sickle cell trait Daughter        fathers side   Colon cancer Neg Hx     No Known Allergies  Current Medications:   Current Outpatient Medications:    cyclobenzaprine  (FLEXERIL ) 10 MG tablet, Take 1 tablet (10 mg total) by mouth 3 (three) times daily as needed for muscle spasms., Disp: 30 tablet, Rfl: 0   ibuprofen (ADVIL) 200 MG tablet, Take 800 mg by mouth every 6 (six) hours as needed., Disp: , Rfl:    semaglutide -weight management (WEGOVY ) 1.7 MG/0.75ML SOAJ SQ injection, Inject 1.7 mg into the skin once a week., Disp: 3 mL, Rfl: 1   Review of Systems:   Negative unless otherwise specified per HPI.  Vitals:   Vitals:   11/12/23 0850  BP: 110/76  Pulse: 77  Temp: (!) 97.5 F (36.4 C)  TempSrc: Temporal  SpO2: 95%  Weight: 213 lb 8 oz (96.8 kg)  Height: 5' 5.5 (1.664 m)     Body mass index is 34.99 kg/m.  Physical Exam:   Physical Exam Vitals and nursing note reviewed. Exam conducted with a chaperone present Lessie, LPN).  Constitutional:      General: She is not in  acute distress.    Appearance: She is well-developed. She is not ill-appearing or toxic-appearing.  Cardiovascular:     Rate and Rhythm: Normal rate and regular rhythm.     Pulses: Normal pulses.     Heart sounds: Normal heart sounds, S1 normal and S2 normal.  Pulmonary:     Effort: Pulmonary effort is normal.     Breath sounds: Normal breath sounds.  Genitourinary:    Rectum: External hemorrhoid present.  Skin:    General: Skin is warm and dry.     Comments: Palpable nodule to R inguinal area; no tenderness to palpation   Neurological:     Mental Status: She is alert.     GCS: GCS eye subscore is 4. GCS verbal subscore is 5. GCS motor subscore is 6.  Psychiatric:        Speech: Speech normal.        Behavior: Behavior normal. Behavior is cooperative.     Assessment and Plan:   Assessment and Plan     External hemorrhoid Small external hemorrhoid with bleeding, likely from straining. Risks include enlargement and blood clots. - Prescribed steroid suppository. - Advised Miralax for stool softening. - Counseled to limit toilet sitting to three minutes.  Hidradenitis suppurativa  Recurrent boils. Current boil smaller, differential includes lymph node inflammation. - Prescribed clindamycin-soaked cotton pads for groin and armpits. - Advised to report worsening condition or lack of resolution    Lucie Buttner, PA-C

## 2023-11-16 ENCOUNTER — Ambulatory Visit (HOSPITAL_COMMUNITY): Admitting: Clinical

## 2023-11-17 DIAGNOSIS — F4310 Post-traumatic stress disorder, unspecified: Secondary | ICD-10-CM | POA: Diagnosis not present

## 2023-11-18 ENCOUNTER — Encounter: Payer: Self-pay | Admitting: Physician Assistant

## 2023-11-24 DIAGNOSIS — F4310 Post-traumatic stress disorder, unspecified: Secondary | ICD-10-CM | POA: Diagnosis not present

## 2023-11-24 NOTE — Progress Notes (Unsigned)
 [facility] History and Physical   Primary Care Physician:  Job Lukes, [place]   Reason for Procedure:  Colorectal cancer screening  Plan:    Colonoscopy     HPI: Monica Washington is a 43 y.o. female undergoing colonoscopy for colorectal cancer screening.  This is the patient's first colonoscopy.  No family history of colorectal cancer or polyps.  Patient denies current symptoms of rectal bleeding or change in bowel habits.   Past Medical History:  Diagnosis Date   Acute back pain with radiculopathy 11/27/2021   PVC (premature ventricular contraction)     Past Surgical History:  Procedure Laterality Date   NO PAST SURGERIES      Prior to Admission medications   Medication Sig Start Date End Date Taking? Authorizing Provider  clindamycin (CLEOCIN T) 1 % SWAB Apply to groin or armpits twice daily as needed for flares. 11/12/23   Job Lukes, PA  cyclobenzaprine  (FLEXERIL ) 10 MG tablet Take 1 tablet (10 mg total) by mouth 3 (three) times daily as needed for muscle spasms. 04/15/23   Job Lukes, PA  hydrocortisone (ANUSOL-HC) 25 MG suppository Place 1 suppository (25 mg total) rectally 2 (two) times daily. 11/12/23   Job Lukes, PA  ibuprofen (ADVIL) 200 MG tablet Take 800 mg by mouth every 6 (six) hours as needed.    [provider]  semaglutide -weight management (WEGOVY ) 1.7 MG/0.75ML SOAJ SQ injection Inject 1.7 mg into the skin once a week. 10/20/23   Job Lukes, PA    Current Outpatient Medications  Medication Sig Dispense Refill   clindamycin (CLEOCIN T) 1 % SWAB Apply to groin or armpits twice daily as needed for flares. 60 each 1   cyclobenzaprine  (FLEXERIL ) 10 MG tablet Take 1 tablet (10 mg total) by mouth 3 (three) times daily as needed for muscle spasms. 30 tablet 0   hydrocortisone (ANUSOL-HC) 25 MG suppository Place 1 suppository (25 mg total) rectally 2 (two) times daily. 12 suppository 0   ibuprofen (ADVIL) 200 MG tablet  Take 800 mg by mouth every 6 (six) hours as needed.     semaglutide -weight management (WEGOVY ) 1.7 MG/0.75ML SOAJ SQ injection Inject 1.7 mg into the skin once a week. 3 mL 1   No current facility-administered medications for this visit.    Allergies as of 11/25/2023   (No Known Allergies)    Family History  Problem Relation Age of Onset   Healthy Mother    Diabetes Father    Prostate cancer Father    Diabetes Sister        most of the girls are borderline diabetes   Alzheimer's disease Maternal Grandmother    Breast cancer Maternal Aunt        55s   Sickle cell trait Daughter        fathers side   Colon cancer Neg Hx     Social History   Socioeconomic History   Marital status: Single    Spouse name: Not on file   Number of children: 2   Years of education: Not on file   Highest education level: Bachelor's degree (e.g., BA, AB, BS)  Occupational History   Occupation: self employed  Tobacco Use   Smoking status: Former    Current packs/day: 0.00    Types: Cigarettes    Quit date: 01/19/2013    Years since quitting: 10.8   Smokeless tobacco: Never  Vaping Use   Vaping status: Former  Substance and Sexual Activity   Alcohol use:  Not Currently    Comment: cannot tolerate   Drug use: Never   Sexual activity: Not Currently    Birth control/protection: None  Other Topics Concern   Not on file  Social History Narrative   Has 44 yo and 43 yo   From [address]   Still doing [employer]   Social Drivers of Health   Financial Resource Strain: Medium Risk (08/04/2022)   Overall Financial Resource Strain (CARDIA)    Difficulty of Paying Living Expenses: Somewhat hard  Food Insecurity: Food Insecurity Present (08/04/2022)   Hunger Vital Sign    Worried About Running Out of Food in the Last Year: Sometimes true    Ran Out of Food in the Last Year: Never true  Transportation Needs: No Transportation Needs (08/04/2022)   PRAPARE - Administrator, Civil Service  (Medical): No    Lack of Transportation (Non-Medical): No  Physical Activity: Unknown (08/04/2022)   Exercise Vital Sign    Days of Exercise per Week: 0 days    Minutes of Exercise per Session: Not on file  Stress: Stress Concern Present (08/04/2022)   [employer] of Occupational Health - Occupational Stress Questionnaire    Feeling of Stress : To some extent  Social Connections: Moderately Isolated (08/04/2022)   Social Connection and Isolation Panel    Frequency of Communication with Friends and Family: More than three times a week    Frequency of Social Gatherings with Friends and Family: Once a week    Attends Religious Services: More than 4 times per year    Active Member of [employer] or Organizations: No    Attends Engineer, Structural: Not on file    Marital Status: Divorced  Intimate Partner Violence: Not on file    Review of Systems:  All other review of systems negative except as mentioned in the HPI.  Physical Exam: Vital signs LMP 11/06/2023 (Exact Date)   General:   Alert,  Well-developed, well-nourished, pleasant and cooperative in NAD Airway:  Mallampati  Lungs:  Clear throughout to auscultation.   Heart:  Regular rate and rhythm; no murmurs, clicks, rubs,  or gallops. Abdomen:  Soft, nontender and nondistended. Normal bowel sounds.   Neuro/Psych:  Normal mood and affect. A and O x 3  Inocente Hausen, MD [facility]

## 2023-11-25 ENCOUNTER — Encounter: Payer: Self-pay | Admitting: Pediatrics

## 2023-11-25 ENCOUNTER — Ambulatory Visit (AMBULATORY_SURGERY_CENTER): Admitting: Pediatrics

## 2023-11-25 VITALS — BP 118/84 | HR 76 | Temp 97.4°F | Resp 18 | Ht 65.5 in | Wt 218.2 lb

## 2023-11-25 DIAGNOSIS — I493 Ventricular premature depolarization: Secondary | ICD-10-CM | POA: Diagnosis not present

## 2023-11-25 DIAGNOSIS — K648 Other hemorrhoids: Secondary | ICD-10-CM

## 2023-11-25 DIAGNOSIS — Z1211 Encounter for screening for malignant neoplasm of colon: Secondary | ICD-10-CM | POA: Diagnosis not present

## 2023-11-25 DIAGNOSIS — R194 Change in bowel habit: Secondary | ICD-10-CM

## 2023-11-25 MED ORDER — SODIUM CHLORIDE 0.9 % IV SOLN: 500.0000 mL | Freq: Once | INTRAVENOUS | Status: DC

## 2023-11-25 NOTE — Op Note (Signed)
 [facility] Patient Name: Monica Washington Procedure Date: 11/25/2023 10:01 AM MRN: [id number] Endoscopist: Inocente Hausen , MD, [id number] Age: 43 Referring MD:  Date of Birth: 11-23-1980 Gender: Female Account #: [id number] Procedure:                Colonoscopy Indications:              Screening for colorectal malignant neoplasm, This                            is the patient's first colonoscopy Medicines:                Monitored Anesthesia Care Procedure:                Pre-Anesthesia Assessment:                           - Prior to the procedure, a History and Physical                            was performed, and patient medications and                            allergies were reviewed. The patient's tolerance of                            previous anesthesia was also reviewed. The risks                            and benefits of the procedure and the sedation                            options and risks were discussed with the patient.                            All questions were answered, and informed consent                            was obtained. Prior Anticoagulants: The patient has                            taken no anticoagulant or antiplatelet agents. ASA                            Grade Assessment: II - A patient with mild systemic                            disease. After reviewing the risks and benefits,                            the patient was deemed in satisfactory condition to                            undergo the procedure.  After obtaining informed consent, the colonoscope                            was passed under direct vision. Throughout the                            procedure, the patient's blood pressure, pulse, and                            oxygen saturations were monitored continuously. The                            Olympus Scope SN: [id number] was introduced through                            the anus and advanced  to the cecum, identified by                            appendiceal orifice and ileocecal valve. The                            colonoscopy was performed without difficulty. The                            patient tolerated the procedure well. The quality                            of the bowel preparation was good. The ileocecal                            valve, appendiceal orifice, and rectum were                            photographed. Scope In: 10:34:56 AM Scope Out: 10:49:07 AM Scope Withdrawal Time: 0 hours 8 minutes 14 seconds  Total Procedure Duration: 0 hours 14 minutes 11 seconds  Findings:                 The perianal and digital rectal examinations were                            normal. Pertinent negatives include normal                            sphincter tone and no palpable rectal lesions.                           The colon (entire examined portion) appeared normal.                           Internal hemorrhoids were found during retroflexion. Complications:            No immediate complications. Estimated blood loss:  Minimal. Estimated Blood Loss:     Estimated blood loss was minimal. Impression:               - The entire examined colon is normal.                           - Internal hemorrhoids.                           - No specimens collected. Recommendation:           - Discharge patient to home (ambulatory).                           - Repeat colonoscopy in 10 years for screening                            purposes.                           - The findings and recommendations were discussed                            with the patient's family.                           - Patient has a contact number available for                            emergencies. The signs and symptoms of potential                            delayed complications were discussed with the                            patient. Return to normal activities tomorrow.                             Written discharge instructions were provided to the                            patient. Inocente Hausen, MD 11/25/2023 10:53:41 AM This report has been signed electronically.

## 2023-11-25 NOTE — Patient Instructions (Addendum)
Resume previous diet Continue present medications There were no colon polyps seen today!  You will need another screening colonoscopy in 10 years, you will receive a letter at that time when you are due for the procedure.    Please call us at [phone] if you have a change in bowel habits, change in family history of colo-rectal cancer, rectal bleeding or other GI concern before that time.  Handouts/information given for hemorrhoids  YOU HAD AN ENDOSCOPIC PROCEDURE TODAY AT THE [facility]:   Refer to the procedure report that was given to you for any specific questions about what was found during the examination.  If the procedure report does not answer your questions, please call your gastroenterologist to clarify.  If you requested that your care partner not be given the details of your procedure findings, then the procedure report has been included in a sealed envelope for you to review at your convenience later.  YOU SHOULD EXPECT: Some feelings of bloating in the abdomen. Passage of more gas than usual.  Walking can help get rid of the air that was put into your GI tract during the procedure and reduce the bloating. If you had a lower endoscopy (such as a colonoscopy or flexible sigmoidoscopy) you may notice spotting of blood in your stool or on the toilet paper. If you underwent a bowel prep for your procedure, you may not have a normal bowel movement for a few days.  Please Note:  You might notice some irritation and congestion in your nose or some drainage.  This is from the oxygen used during your procedure.  There is no need for concern and it should clear up in a day or so.   SYMPTOMS TO REPORT IMMEDIATELY:  Following lower endoscopy (colonoscopy):  Excessive amounts of blood in the stool  Significant tenderness or worsening of abdominal pains  Swelling of the abdomen that is new, acute  Fever of 100F or higher  For urgent or emergent issues, a gastroenterologist  can be reached at any hour by calling [phone]. Do not use MyChart messaging for urgent concerns.   DIET:  We do recommend a small meal at first, but then you may proceed to your regular diet.  Drink plenty of fluids but you should avoid alcoholic beverages for 24 hours.  ACTIVITY:  You should plan to take it easy for the rest of today and you should NOT DRIVE or use heavy machinery until tomorrow (because of the sedation medicines used during the test).    FOLLOW UP: Our staff will call the number listed on your records the next business day following your procedure.  We will call around 7:15- 8:00 am to check on you and address any questions or concerns that you may have regarding the information given to you following your procedure. If we do not reach you, we will leave a message.     SIGNATURES/CONFIDENTIALITY: You and/or your care partner have signed paperwork which will be entered into your electronic medical record.  These signatures attest to the fact that that the information above on your After Visit Summary has been reviewed and is understood.  Full responsibility of the confidentiality of this discharge information lies with you and/or your care-partner.

## 2023-11-25 NOTE — Progress Notes (Signed)
 Pt's states no medical or surgical changes since previsit or office visit.

## 2023-11-25 NOTE — Progress Notes (Signed)
 Sedate, gd SR, tolerated procedure well, VSS, report to RN

## 2023-11-26 ENCOUNTER — Telehealth: Payer: Self-pay

## 2023-11-26 NOTE — Telephone Encounter (Signed)
  Follow up Call-     11/25/2023   10:15 AM  Call back number  Post procedure Call Back phone  # [phone]  Permission to leave phone message Yes     Patient questions:  Do you have a fever, pain , or abdominal swelling? No. Pain Score  0 *  Have you tolerated food without any problems? Yes.    Have you been able to return to your normal activities? Yes.    Do you have any questions about your discharge instructions: Diet   No. Medications  No. Follow up visit  No.  Do you have questions or concerns about your Care? No.  Actions: * If pain score is 4 or above: No action needed, pain <4.

## 2023-11-29 ENCOUNTER — Ambulatory Visit (HOSPITAL_COMMUNITY): Admitting: Clinical

## 2023-12-01 DIAGNOSIS — Z419 Encounter for procedure for purposes other than remedying health state, unspecified: Secondary | ICD-10-CM | POA: Diagnosis not present

## 2023-12-09 DIAGNOSIS — F4310 Post-traumatic stress disorder, unspecified: Secondary | ICD-10-CM | POA: Diagnosis not present

## 2023-12-21 ENCOUNTER — Encounter: Payer: Self-pay | Admitting: Physician Assistant

## 2023-12-22 DIAGNOSIS — F4310 Post-traumatic stress disorder, unspecified: Secondary | ICD-10-CM | POA: Diagnosis not present

## 2023-12-28 ENCOUNTER — Ambulatory Visit: Admitting: Physician Assistant

## 2023-12-28 ENCOUNTER — Encounter: Payer: Self-pay | Admitting: Physician Assistant

## 2023-12-28 VITALS — BP 110/70 | HR 79 | Temp 97.8°F | Ht 65.5 in | Wt 220.0 lb

## 2023-12-28 DIAGNOSIS — E669 Obesity, unspecified: Secondary | ICD-10-CM

## 2023-12-28 DIAGNOSIS — H0100B Unspecified blepharitis left eye, upper and lower eyelids: Secondary | ICD-10-CM | POA: Diagnosis not present

## 2023-12-28 MED ORDER — PHENTERMINE HCL 37.5 MG PO TABS: 37.5000 mg | ORAL_TABLET | Freq: Every day | ORAL | 2 refills | Status: AC

## 2023-12-28 NOTE — Progress Notes (Signed)
 History of Present Illness:   Chief Complaint  Patient presents with   Weight Management Screening    Pt is here to discuss weight loss medications.     Discussed the use of AI scribe software for clinical note transcription with the patient, who gave verbal consent to proceed.  History of Present Illness   Monica Washington is a 43 year old female who presents with weight management and eye discomfort.  She reports weight gain since her god sister passed away a week before Thanksgiving, after previously losing weight on Wegovy  to 206 lb. She notes decreased exercise and more sweets and junk food. She has used phentermine  in the past, taking a full tablet in the morning before eating.  She has soreness under the eyelid of one eye without redness or swelling. She does not usually wear contacts. She uses false eyelashes when going out and thinks this may have triggered the discomfort. She has used a warm pad with some relief.        Past Medical History:  Diagnosis Date   Acute back pain with radiculopathy 11/27/2021   PVC (premature ventricular contraction)    Special screening for malignant neoplasms, colon 11/25/2023     Social History   Tobacco Use   Smoking status: Former    Current packs/day: 0.00    Types: Cigarettes    Quit date: 01/19/2013    Years since quitting: 10.9   Smokeless tobacco: Never  Vaping Use   Vaping status: Former  Substance Use Topics   Alcohol use: Not Currently    Comment: cannot tolerate   Drug use: Never    Past Surgical History:  Procedure Laterality Date   NO PAST SURGERIES      Family History  Problem Relation Age of Onset   Healthy Mother    Diabetes Father    Prostate cancer Father    Diabetes Sister        most of the girls are borderline diabetes   Breast cancer Maternal Aunt        42s   Alzheimer's disease Maternal Grandmother    Sickle cell trait Daughter        fathers side   Colon cancer Neg Hx    Esophageal  cancer Neg Hx    Rectal cancer Neg Hx    Stomach cancer Neg Hx     No Known Allergies  Current Medications:   Current Outpatient Medications:    clindamycin  (CLEOCIN  T) 1 % SWAB, Apply to groin or armpits twice daily as needed for flares., Disp: 60 each, Rfl: 1   cyclobenzaprine  (FLEXERIL ) 10 MG tablet, Take 1 tablet (10 mg total) by mouth 3 (three) times daily as needed for muscle spasms., Disp: 30 tablet, Rfl: 0   hydrocortisone  (ANUSOL -HC) 25 MG suppository, Place 1 suppository (25 mg total) rectally 2 (two) times daily., Disp: 12 suppository, Rfl: 0   ibuprofen (ADVIL) 200 MG tablet, Take 800 mg by mouth every 6 (six) hours as needed., Disp: , Rfl:    Review of Systems:   Negative unless otherwise specified per HPI.  Vitals:   Vitals:   12/28/23 1023  BP: 110/70  Pulse: 79  Temp: 97.8 F (36.6 C)  TempSrc: Temporal  SpO2: 95%  Weight: 220 lb (99.8 kg)  Height: 5' 5.5 (1.664 m)     Body mass index is 36.05 kg/m.  Physical Exam:   Physical Exam Vitals and nursing note reviewed.  Constitutional:  General: She is not in acute distress.    Appearance: She is well-developed. She is not ill-appearing or toxic-appearing.  Cardiovascular:     Rate and Rhythm: Normal rate and regular rhythm.     Pulses: Normal pulses.     Heart sounds: Normal heart sounds, S1 normal and S2 normal.  Pulmonary:     Effort: Pulmonary effort is normal.     Breath sounds: Normal breath sounds.  Skin:    General: Skin is warm and dry.  Neurological:     Mental Status: She is alert.     GCS: GCS eye subscore is 4. GCS verbal subscore is 5. GCS motor subscore is 6.  Psychiatric:        Speech: Speech normal.        Behavior: Behavior normal. Behavior is cooperative.     Assessment and Plan:   Assessment and Plan    Obesity Weight gain post-Wegovy  discontinuation, emotional eating noted. Prefers phentermine  without Topamax . Discussed exercise and diet as long-term strategies. -  Prescribed phentermine , morning dose before eating. Aware of risks and side effect(s) of medication and that this is not a long term solution - Advised increased water intake to prevent constipation. - Encouraged exercise and healthy eating. - Follow-up in three months to assess progress and discuss new oral weight loss medications.  Blepharitis Soreness and irritation under eye, likely blepharitis. Possible eyelash application irritation. - Advised warm compresses and baby shampoo for eyelid cleaning. - Instructed to avoid eyelash application until symptoms resolve.       Lucie Buttner, PA-C

## 2023-12-28 NOTE — Patient Instructions (Signed)
 If any chest pain, shortness of breath with phentermine  -- please stop this.  Follow up in 3 months.

## 2023-12-29 ENCOUNTER — Ambulatory Visit: Admitting: Physician Assistant

## 2023-12-29 ENCOUNTER — Encounter: Payer: Self-pay | Admitting: Physician Assistant

## 2023-12-30 MED ORDER — IBUPROFEN 200 MG PO TABS: 800.0000 mg | ORAL_TABLET | Freq: Four times a day (QID) | ORAL | 1 refills | Status: DC | PRN

## 2023-12-30 NOTE — Telephone Encounter (Signed)
 Please see message and advise if okay to order Ibuprofen 800 mg?

## 2023-12-31 MED ORDER — IBUPROFEN 800 MG PO TABS: 800.0000 mg | ORAL_TABLET | Freq: Three times a day (TID) | ORAL | 1 refills | Status: AC | PRN

## 2023-12-31 NOTE — Addendum Note (Signed)
 Addended by: THURMON ARLAND PARAS on: 12/31/2023 08:23 AM   Modules accepted: Orders

## 2024-01-24 ENCOUNTER — Encounter: Payer: Self-pay | Admitting: Physician Assistant

## 2024-10-20 ENCOUNTER — Encounter: Admitting: Physician Assistant
